# Patient Record
Sex: Female | Born: 1965 | Race: White | Hispanic: No | Marital: Married | Smoking: Never smoker
Health system: Southern US, Community
[De-identification: ages and names within clinical notes are randomized; demographics above are authoritative.]

## PROBLEM LIST (undated history)

## (undated) DIAGNOSIS — N943 Premenstrual tension syndrome: Secondary | ICD-10-CM

## (undated) DIAGNOSIS — T7840XA Allergy, unspecified, initial encounter: Secondary | ICD-10-CM

## (undated) DIAGNOSIS — Z973 Presence of spectacles and contact lenses: Secondary | ICD-10-CM

## (undated) DIAGNOSIS — D509 Iron deficiency anemia, unspecified: Secondary | ICD-10-CM

## (undated) DIAGNOSIS — N921 Excessive and frequent menstruation with irregular cycle: Secondary | ICD-10-CM

## (undated) DIAGNOSIS — G43909 Migraine, unspecified, not intractable, without status migrainosus: Secondary | ICD-10-CM

## (undated) DIAGNOSIS — J302 Other seasonal allergic rhinitis: Secondary | ICD-10-CM

## (undated) DIAGNOSIS — N920 Excessive and frequent menstruation with regular cycle: Secondary | ICD-10-CM

## (undated) DIAGNOSIS — F419 Anxiety disorder, unspecified: Secondary | ICD-10-CM

## (undated) DIAGNOSIS — D649 Anemia, unspecified: Secondary | ICD-10-CM

## (undated) DIAGNOSIS — N840 Polyp of corpus uteri: Secondary | ICD-10-CM

## (undated) HISTORY — PX: WISDOM TOOTH EXTRACTION: SHX21

## (undated) HISTORY — DX: Premenstrual tension syndrome: N94.3

## (undated) HISTORY — DX: Migraine, unspecified, not intractable, without status migrainosus: G43.909

## (undated) HISTORY — DX: Anxiety disorder, unspecified: F41.9

## (undated) HISTORY — DX: Excessive and frequent menstruation with regular cycle: N92.0

## (undated) HISTORY — DX: Anemia, unspecified: D64.9

## (undated) HISTORY — DX: Allergy, unspecified, initial encounter: T78.40XA

## (undated) HISTORY — PX: COLONOSCOPY WITH PROPOFOL: SHX5780

---

## 1998-02-23 ENCOUNTER — Other Ambulatory Visit: Admission: RE | Admit: 1998-02-23 | Discharge: 1998-02-23 | Payer: Self-pay | Admitting: Obstetrics and Gynecology

## 1998-09-15 ENCOUNTER — Inpatient Hospital Stay (HOSPITAL_COMMUNITY): Admission: AD | Admit: 1998-09-15 | Discharge: 1998-09-17 | Payer: Self-pay | Admitting: Obstetrics and Gynecology

## 1999-05-29 ENCOUNTER — Other Ambulatory Visit: Admission: RE | Admit: 1999-05-29 | Discharge: 1999-05-29 | Payer: Self-pay | Admitting: Obstetrics & Gynecology

## 2001-08-11 ENCOUNTER — Other Ambulatory Visit: Admission: RE | Admit: 2001-08-11 | Discharge: 2001-08-11 | Payer: Self-pay | Admitting: Obstetrics and Gynecology

## 2002-06-01 ENCOUNTER — Encounter: Admission: RE | Admit: 2002-06-01 | Discharge: 2002-06-01 | Payer: Self-pay | Admitting: Orthopedic Surgery

## 2002-06-01 ENCOUNTER — Encounter: Payer: Self-pay | Admitting: Orthopedic Surgery

## 2003-08-03 ENCOUNTER — Other Ambulatory Visit: Admission: RE | Admit: 2003-08-03 | Discharge: 2003-08-03 | Payer: Self-pay | Admitting: Obstetrics and Gynecology

## 2003-08-17 ENCOUNTER — Encounter: Admission: RE | Admit: 2003-08-17 | Discharge: 2003-08-17 | Payer: Self-pay | Admitting: Obstetrics and Gynecology

## 2004-08-15 ENCOUNTER — Other Ambulatory Visit: Admission: RE | Admit: 2004-08-15 | Discharge: 2004-08-15 | Payer: Self-pay | Admitting: Obstetrics and Gynecology

## 2004-11-14 ENCOUNTER — Ambulatory Visit: Payer: Self-pay | Admitting: Internal Medicine

## 2005-07-24 ENCOUNTER — Ambulatory Visit: Payer: Self-pay | Admitting: Internal Medicine

## 2005-11-27 ENCOUNTER — Other Ambulatory Visit: Admission: RE | Admit: 2005-11-27 | Discharge: 2005-11-27 | Payer: Self-pay | Admitting: Obstetrics and Gynecology

## 2005-12-11 ENCOUNTER — Encounter: Admission: RE | Admit: 2005-12-11 | Discharge: 2005-12-11 | Payer: Self-pay | Admitting: Obstetrics and Gynecology

## 2007-03-16 DIAGNOSIS — R51 Headache: Secondary | ICD-10-CM | POA: Insufficient documentation

## 2007-03-16 DIAGNOSIS — R519 Headache, unspecified: Secondary | ICD-10-CM | POA: Insufficient documentation

## 2007-03-16 DIAGNOSIS — F411 Generalized anxiety disorder: Secondary | ICD-10-CM | POA: Insufficient documentation

## 2007-03-16 DIAGNOSIS — F329 Major depressive disorder, single episode, unspecified: Secondary | ICD-10-CM | POA: Insufficient documentation

## 2007-04-01 ENCOUNTER — Ambulatory Visit: Payer: Self-pay | Admitting: Internal Medicine

## 2007-04-01 DIAGNOSIS — L708 Other acne: Secondary | ICD-10-CM | POA: Insufficient documentation

## 2007-09-24 ENCOUNTER — Other Ambulatory Visit: Admission: RE | Admit: 2007-09-24 | Discharge: 2007-09-24 | Payer: Self-pay | Admitting: Obstetrics & Gynecology

## 2007-10-07 ENCOUNTER — Encounter: Admission: RE | Admit: 2007-10-07 | Discharge: 2007-10-07 | Payer: Self-pay | Admitting: Obstetrics and Gynecology

## 2009-04-07 ENCOUNTER — Ambulatory Visit: Payer: Self-pay | Admitting: Internal Medicine

## 2009-04-19 ENCOUNTER — Encounter: Admission: RE | Admit: 2009-04-19 | Discharge: 2009-04-19 | Payer: Self-pay | Admitting: Obstetrics and Gynecology

## 2009-07-28 ENCOUNTER — Encounter: Payer: Self-pay | Admitting: Internal Medicine

## 2009-07-28 ENCOUNTER — Encounter: Payer: Self-pay | Admitting: *Deleted

## 2009-07-28 LAB — CONVERTED CEMR LAB
ALT: 13 units/L
HCT: 37.4 %
Hemoglobin: 12.7 g/dL
LDL Cholesterol: 99 mg/dL
TSH: 0.868 microintl units/mL
Total CHOL/HDL Ratio: 3.1

## 2009-08-23 ENCOUNTER — Encounter: Payer: Self-pay | Admitting: *Deleted

## 2010-05-29 ENCOUNTER — Telehealth: Payer: Self-pay | Admitting: *Deleted

## 2010-06-20 ENCOUNTER — Ambulatory Visit: Payer: Self-pay | Admitting: Internal Medicine

## 2010-08-16 ENCOUNTER — Other Ambulatory Visit: Payer: Self-pay | Admitting: Obstetrics and Gynecology

## 2010-08-16 DIAGNOSIS — Z1239 Encounter for other screening for malignant neoplasm of breast: Secondary | ICD-10-CM

## 2010-08-23 ENCOUNTER — Ambulatory Visit
Admission: RE | Admit: 2010-08-23 | Discharge: 2010-08-23 | Disposition: A | Discharge status: Home / Self Care | Payer: PRIVATE HEALTH INSURANCE | Source: Ambulatory Visit | Attending: Obstetrics and Gynecology | Admitting: Obstetrics and Gynecology

## 2010-08-23 DIAGNOSIS — Z1239 Encounter for other screening for malignant neoplasm of breast: Secondary | ICD-10-CM

## 2010-08-23 NOTE — Progress Notes (Signed)
Summary: REFILL REQUEST (Paxil)  Phone Note Refill Request Message from:  Patient on May 29, 2010 2:06 PM  Refills Requested: Medication #1:  PAROXETINE HCL 10 MG TABS 1/2 to 1 by mouth once daily.   Notes: [facility] [place].Marland KitchenMarland KitchenMarland KitchenPt has appt on 11/30  at  3:15pm.    Initial call taken by: Debbra Riding,  May 29, 2010 2:06 PM  Follow-up for Phone Call        Rx already sent to pharmacy. Follow-up by: Romualdo Bolk, CMA (AAMA),  May 29, 2010 2:09 PM

## 2010-08-23 NOTE — Miscellaneous (Signed)
Summary: labs done by Ria Comment, FNP  Clinical Lists Changes  Observations: Added new observation of TSH: 0.868 microintl units/mL (07/28/2009 9:23) Added new observation of CHOL/HDL: 3.1  (07/28/2009 9:23) Added new observation of LDL: 99 mg/dL (63/07/6008 9:32) Added new observation of HDL: 57 mg/dL (35/57/3220 2:54) Added new observation of TRIGLYC TOT: 105 mg/dL (27/12/2374 2:83) Added new observation of CHOLESTEROL: 177 mg/dL (15/17/6160 7:37) Added new observation of SGPT (ALT): 13 units/L (07/28/2009 9:23) Added new observation of SGOT (AST): 20 units/L (07/28/2009 9:23) Added new observation of CREATININE: 0.82 mg/dL (10/62/6948 5:46) Added new observation of HCT: 37.4 % (07/28/2009 9:23) Added new observation of HGB: 12.7 g/dL (27/09/5007 3:81)

## 2010-08-23 NOTE — Assessment & Plan Note (Signed)
Summary: MED CK / REFILL // RS   Vital Signs:  Patient profile:   45 year old female Menstrual status:  regular Weight:      117 pounds BMI:     22.01 Temp:     98.4 degrees F oral Pulse rate:   84 / minute BP sitting:   108 / 72  (left arm) Cuff size:   regular  Vitals Entered By: Alfred Levins, CMA (June 20, 2010 3:28 PM) CC: renew med   History of Present Illness: Desiree Garcia  comes in today  for follow up of meds .  Her last visit was September 2010 . She sees her gyne   otherwise genreally healthy and no other health change .  Had stopped the med  in the summer. and then was having problems  related to teenagers at home 9 has 45 Yo at [facility] and asked for refill  and was out and in for this.   Paxil  had always been helpful in the past however concerned about side effects of the medication including weight gain and sexual side effects. However more recently she feels that need of help becuase of  increased anxiety.    denies specific depression.  Also described a long-standing problem with discoloration of her distal middle fingers when it gets cold. She she states they turn white  and blue like Raynauds  phenomenon. she does not get this on her feet sometimes gets it on her thumbs.   There is no history of arthritis fever joint swelling. 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 Otherwise no repeated injury vibration to hands. She denies numbness.    Preventive Screening-Counseling & Management  Alcohol-Tobacco     Alcohol drinks/day: <1     Alcohol type: Wine     Smoking Status: never  Current Medications (verified): 1)  Paroxetine Hcl 10 Mg Tabs (Paroxetine Hcl) .... 1/2 To 1 By Mouth Once Daily  Allergies (verified): No Known Drug Allergies  Past History:  Past medical, surgical, family and social histories (including risk factors) reviewed, and no changes noted (except as noted below).  Past Medical History: Reviewed  history from 04/07/2009 and no changes required. Depression Scarlet Fever - childhood Anxiety Fatigue Headache Acne accutane rx  2010  Past Surgical History: Reviewed history from 03/16/2007 and no changes required. CB x2  Family History: Reviewed history from 04/07/2009 and no changes required. Family History Diabetes 1st degree relative Family History High cholesterol  Father:  COPD  ex smoker  Mother: HT Siblings:  sisters healthy.   Social History: Reviewed history from 03/16/2007 and no changes required. Occupation: Oral Maxillary Facial Surgical Assistant Married Never Smoked Alcohol use-yes Drug use-no Regular exercise-no  Review of Systems  The patient denies anorexia, fever, weight loss, weight gain, vision loss, decreased hearing, hoarseness, chest pain, syncope, dyspnea on exertion, peripheral edema, prolonged cough, headaches, abdominal pain, melena, hematochezia, severe indigestion/heartburn, hematuria, muscle weakness, difficulty walking, depression, abnormal bleeding, enlarged lymph nodes, and angioedema.    Physical Exam  General:  Well-developed,well-nourished,in no acute distress; alert,appropriate and cooperative throughout examination Head:  normocephalic, atraumatic, and no abnormalities observed.   Eyes:  vision grossly intact and pupils equal.   Neck:  No deformities, masses, or tenderness noted. Lungs:  Normal respiratory effort, chest expands symmetrically. Lungs are clear to auscultation, no crackles or wheezes.no dullness.   Heart:  Normal rate and regular rhythm. S1 and S2 normal without gallop,  murmur, click, rub or other extra sounds. faint sem lusb when laying down Abdomen:  Bowel sounds positive,abdomen soft and non-tender without masses, organomegaly or   noted. Msk:  no joint swelling, no joint warmth, and no redness over joints.   Pulses:  pulses intact without delay   Extremities:  no clubbing cyanosis or edema  no atrophy could grip    Neurologic:  alert & oriented X3, strength normal in all extremities, sensation intact to pinprick, and gait normal.   Skin:  turgor normal, color normal, no ecchymoses, and no petechiae.  nope chronic changes on hands Cervical Nodes:  No lymphadenopathy noted Psych:  Oriented X3, normally interactive, good eye contact, not anxious appearing, and not depressed appearing.     Impression & Recommendations:  Problem # 1:  ANXIETY (ICD-300.00) had stopped medication a few months ago. Discussed other options which would probably be controller serotonin medicines having potential side effects but perhaps not the same she prefers at this point to try low-dose Paxil at the 5 mg dose she will take a half of her current prescription and see how she does she can call for trials of other medications or interventions.. Her updated medication list for this problem includes:    Paroxetine Hcl 10 Mg Tabs (Paroxetine hcl) .Marland Kitchen... 1/2 to 1 by mouth once daily  Problem # 2:  ? 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  no systemic symptoms.   ? if  at all related to her job which requires manipualtion and tratioin using these fingers.   Marland Kitchen at this point no intervention except warming and to follow up if gets systemic signs or i r progressive.  Complete Medication List: 1)  Paroxetine Hcl 10 Mg Tabs (Paroxetine hcl) .... 1/2 to 1 by mouth once daily  Patient Instructions: 1)  trial of 5 mg  per day of paxil     trial and then see what to do .  2)  consider cognitive  therapy. 3)  call if raynauds  if   progressive.   Prescriptions: PAROXETINE HCL 10 MG TABS (PAROXETINE HCL) 1/2 to 1 by mouth once daily  #30 Tablet x 6   Entered and Authorized by:   Madelin Headings MD   Signed by:   Madelin Headings MD on 06/20/2010   Method used:   [facility]        [facility] [address] [facility]       [address]       [place], [place]  [address]       Ph:  [phone] or [phone]       Fax: [phone]   RxID:   [id number]    Orders Added: 1)  Est. Patient Level IV [03474]   greater than 50% of visit spent in counseling   25 minutes

## 2010-08-29 ENCOUNTER — Other Ambulatory Visit: Payer: Self-pay | Admitting: Obstetrics and Gynecology

## 2010-08-29 DIAGNOSIS — R928 Other abnormal and inconclusive findings on diagnostic imaging of breast: Secondary | ICD-10-CM

## 2010-08-30 ENCOUNTER — Ambulatory Visit
Admission: RE | Admit: 2010-08-30 | Discharge: 2010-08-30 | Disposition: A | Discharge status: Home / Self Care | Payer: PRIVATE HEALTH INSURANCE | Source: Ambulatory Visit

## 2010-08-30 ENCOUNTER — Ambulatory Visit: Admission: RE | Admit: 2010-08-30 | Payer: PRIVATE HEALTH INSURANCE | Source: Ambulatory Visit

## 2010-08-30 ENCOUNTER — Other Ambulatory Visit: Payer: Self-pay | Admitting: Obstetrics and Gynecology

## 2010-08-30 ENCOUNTER — Ambulatory Visit
Admission: RE | Admit: 2010-08-30 | Discharge: 2010-08-30 | Disposition: A | Discharge status: Home / Self Care | Payer: PRIVATE HEALTH INSURANCE | Source: Ambulatory Visit | Attending: Obstetrics and Gynecology | Admitting: Obstetrics and Gynecology

## 2010-08-30 DIAGNOSIS — R928 Other abnormal and inconclusive findings on diagnostic imaging of breast: Secondary | ICD-10-CM

## 2011-07-30 ENCOUNTER — Other Ambulatory Visit: Payer: Self-pay | Admitting: Obstetrics and Gynecology

## 2011-07-30 DIAGNOSIS — Z1231 Encounter for screening mammogram for malignant neoplasm of breast: Secondary | ICD-10-CM

## 2011-08-28 ENCOUNTER — Ambulatory Visit
Admission: RE | Admit: 2011-08-28 | Discharge: 2011-08-28 | Disposition: A | Discharge status: Home / Self Care | Payer: PRIVATE HEALTH INSURANCE | Source: Ambulatory Visit | Attending: Obstetrics and Gynecology | Admitting: Obstetrics and Gynecology

## 2011-08-28 DIAGNOSIS — Z1231 Encounter for screening mammogram for malignant neoplasm of breast: Secondary | ICD-10-CM

## 2012-09-01 ENCOUNTER — Other Ambulatory Visit: Payer: Self-pay | Admitting: *Deleted

## 2012-09-01 DIAGNOSIS — Z1231 Encounter for screening mammogram for malignant neoplasm of breast: Secondary | ICD-10-CM

## 2012-09-23 ENCOUNTER — Ambulatory Visit
Admission: RE | Admit: 2012-09-23 | Discharge: 2012-09-23 | Disposition: A | Discharge status: Home / Self Care | Payer: BC Managed Care – PPO | Source: Ambulatory Visit | Attending: *Deleted | Admitting: *Deleted

## 2012-09-23 ENCOUNTER — Ambulatory Visit: Payer: PRIVATE HEALTH INSURANCE

## 2012-09-23 DIAGNOSIS — Z1231 Encounter for screening mammogram for malignant neoplasm of breast: Secondary | ICD-10-CM

## 2012-11-30 ENCOUNTER — Encounter: Payer: Self-pay | Admitting: *Deleted

## 2012-12-01 ENCOUNTER — Ambulatory Visit (INDEPENDENT_AMBULATORY_CARE_PROVIDER_SITE_OTHER): Payer: BC Managed Care – PPO | Admitting: Nurse Practitioner

## 2012-12-01 ENCOUNTER — Encounter: Payer: Self-pay | Admitting: Nurse Practitioner

## 2012-12-01 ENCOUNTER — Encounter: Payer: Self-pay | Admitting: *Deleted

## 2012-12-01 VITALS — BP 118/60 | HR 62 | Resp 12 | Ht 61.0 in | Wt 108.4 lb

## 2012-12-01 DIAGNOSIS — Z01419 Encounter for gynecological examination (general) (routine) without abnormal findings: Secondary | ICD-10-CM

## 2012-12-01 DIAGNOSIS — Z0000 Encounter for general adult medical examination without abnormal findings: Secondary | ICD-10-CM

## 2012-12-01 LAB — POCT URINALYSIS DIPSTICK
Leukocytes, UA: NEGATIVE
Urobilinogen, UA: NEGATIVE
pH, UA: 6.5

## 2012-12-01 NOTE — Progress Notes (Signed)
47 y.o. G2P2 Married Caucasian Fe here for annual exam.  Menses 28 days, flow is heavier 2-3 days changing every hour using super plus tampon and pad.  History of anemia and started on iron supplements by PCP.  Mother recently diagnosed with lung cancer and will be starting chemo - patient is trying to be supportive but this has caused a lot of stress.  Patient's last menstrual period was 11/07/2012.          Sexually active: yes  The current method of family planning is vasectomy.    Exercising: yes  walk and jog Smoker:  no  Health Maintenance: Pap:  08/16/2011 normal with negative HR HPV MMG:  09/23/2012 normal  Colonoscopy:  never TDaP:  11/2011 Labs: PCP does lab (blood) work.    reports that she has never smoked. She does not have any smokeless tobacco history on file. She reports that she drinks about 0.6 ounces of alcohol per week. She reports that she does not use illicit drugs.  Past Medical History  Diagnosis Date  . Anxiety   . Menorrhagia   . PMS (premenstrual syndrome)   . Anemia     History reviewed. No pertinent past surgical history.  Current Outpatient Prescriptions  Medication Sig Dispense Refill  . escitalopram (LEXAPRO) 5 MG tablet Take 5 mg by mouth daily.      . Iron-Vitamin C (IRON 100/C) 100-250 MG TABS Take by mouth daily.      . Calcium Carbonate-Vitamin D (CALCIUM + D PO) Take by mouth.      . Multiple Vitamin (MULTIVITAMIN) capsule Take 1 capsule by mouth daily.       No current facility-administered medications for this visit.    Family History  Problem Relation Age of Onset  . Hyperlipidemia Mother   . Hypertension Mother   . COPD Father     ROS:  Pertinent items are noted in HPI.  Otherwise, a comprehensive ROS was negative.  Exam:   BP 118/60  Pulse 62  Resp 12  Ht 5\' 1"  (1.549 m)  Wt 108 lb 6.4 oz (49.17 kg)  BMI 20.49 kg/m2  LMP 11/07/2012 Height: 5\' 1"  (154.9 cm)  Ht Readings from Last 3 Encounters:  12/01/12 5\' 1"  (1.549 m)   04/07/09 5' 1.25" (1.556 m)    General appearance: alert, cooperative and appears stated age Head: Normocephalic, without obvious abnormality, atraumatic Neck: no adenopathy, supple, symmetrical, trachea midline and thyroid normal to inspection and palpation Lungs: clear to auscultation bilaterally Breasts: normal appearance, no masses or tenderness Heart: regular rate and rhythm Abdomen: soft, non-tender; no masses,  no organomegaly Extremities: extremities normal, atraumatic, no cyanosis or edema Skin: Skin color, texture, turgor normal. No rashes or lesions Lymph nodes: Cervical, supraclavicular, and axillary nodes normal. No abnormal inguinal nodes palpated Neurologic: Grossly normal   Pelvic: External genitalia:  no lesions              Urethra:  normal appearing urethra with no masses, tenderness or lesions              Bartholin's and Skene's: normal                 Vagina: normal appearing vagina with normal color and discharge, no lesions              Cervix: anteverted              Pap taken: no Bimanual Exam:  Uterus:  normal size, contour,  position, consistency, mobility, non-tender              Adnexa: no mass, fullness, tenderness               Rectovaginal: Confirms               Anus:  normal sphincter tone, no lesions  A:  Well Woman with normal exam  History of menorrhagia and anemia  Husband vasectomy  Situational stressors  P:   Pap smear as per guidelines   Mammogram due 3/15  Will call back if menses gets worse  counseled on breast self exam, adequate intake of calcium and vitamin D,   diet and exercise  return annually or prn  An After Visit Summary was printed and given to the patient.

## 2012-12-01 NOTE — Patient Instructions (Addendum)

## 2012-12-03 NOTE — Progress Notes (Signed)
Encounter reviewed by Dr. Brook Silva.  

## 2013-07-19 ENCOUNTER — Encounter: Payer: Self-pay | Admitting: Nurse Practitioner

## 2013-10-04 ENCOUNTER — Other Ambulatory Visit: Payer: Self-pay

## 2013-10-04 DIAGNOSIS — Z1231 Encounter for screening mammogram for malignant neoplasm of breast: Secondary | ICD-10-CM

## 2013-10-27 ENCOUNTER — Ambulatory Visit: Payer: BC Managed Care – PPO

## 2013-11-10 ENCOUNTER — Ambulatory Visit
Admission: RE | Admit: 2013-11-10 | Discharge: 2013-11-10 | Disposition: A | Discharge status: Home / Self Care | Payer: Self-pay | Source: Ambulatory Visit

## 2013-11-10 ENCOUNTER — Encounter (INDEPENDENT_AMBULATORY_CARE_PROVIDER_SITE_OTHER): Payer: Self-pay

## 2013-11-10 DIAGNOSIS — Z1231 Encounter for screening mammogram for malignant neoplasm of breast: Secondary | ICD-10-CM

## 2013-12-03 ENCOUNTER — Ambulatory Visit: Payer: BC Managed Care – PPO | Admitting: Nurse Practitioner

## 2013-12-07 ENCOUNTER — Encounter: Payer: Self-pay | Admitting: Nurse Practitioner

## 2013-12-07 ENCOUNTER — Ambulatory Visit (INDEPENDENT_AMBULATORY_CARE_PROVIDER_SITE_OTHER): Payer: BC Managed Care – PPO | Admitting: Nurse Practitioner

## 2013-12-07 VITALS — BP 110/56 | HR 64 | Ht 61.0 in | Wt 112.0 lb

## 2013-12-07 DIAGNOSIS — Z01419 Encounter for gynecological examination (general) (routine) without abnormal findings: Secondary | ICD-10-CM

## 2013-12-07 DIAGNOSIS — N920 Excessive and frequent menstruation with regular cycle: Secondary | ICD-10-CM

## 2013-12-07 DIAGNOSIS — Z975 Presence of (intrauterine) contraceptive device: Secondary | ICD-10-CM

## 2013-12-07 NOTE — Patient Instructions (Signed)

## 2013-12-07 NOTE — Progress Notes (Signed)
Patient ID: Desiree Garcia, female   DOB: Feb 24, 1966, 48 y.o.   MRN: [id number] 48 y.o. G2P2 Married Caucasian Desiree Garcia here for annual exam.  Menses last 7-9 days, heavy for 4-5 days, moderate to light.  Changing tampon every 2 hours. Wears extra pad prn.  Clotting, some cramps. Some PMS. Still having to take Desiree Garcia daily.  She is now ready to consider intervention.  Patient's last menstrual period was 11/17/2013.          Sexually active: yes  The current method of family planning is vasectomy.    Exercising: yes  running and walking Smoker:  no  Health Maintenance: Pap: 08/16/2011 normal with negative HR HPV  MMG: 11/10/13, Bi-Rads 1: negative  TDaP: 11/2011  Labs: PCP does lab (blood) work.  Dr Clelia CroftShaw   reports that she has never smoked. She has never used smokeless tobacco. She reports that she drinks about 1.8 ounces of alcohol per week. She reports that she does not use illicit drugs.  Past Medical History  Diagnosis Date  . Anxiety   . Menorrhagia   . PMS (premenstrual syndrome)   . Anemia     Past Surgical History  Procedure Laterality Date  . Wisdom tooth extraction Bilateral age 48    Current Outpatient Prescriptions  Medication Sig Dispense Refill  . Calcium Carbonate-Vitamin D (CALCIUM + D PO) Take by mouth.      . escitalopram (LEXAPRO) 5 MG tablet Take 5 mg by mouth daily.      . ferrous sulfate 325 (65 Desiree Garcia) MG tablet Take 325 mg by mouth daily with breakfast.      . Multiple Vitamin (MULTIVITAMIN) capsule Take 1 capsule by mouth daily.       No current facility-administered medications for this visit.    Family History  Problem Relation Age of Onset  . Hyperlipidemia Mother   . Hypertension Mother   . Cancer - Lung Mother [id number]    stage IV non small cell, chemo starting 5/14  . COPD Father     ROS:  Pertinent items are noted in HPI.  Otherwise, a comprehensive ROS was negative.  Exam:   BP 110/56  Pulse 64  Ht 5\' 1"  (1.549 m)  Wt 112 lb (50.803 kg)  BMI 21.17 kg/m2   LMP 11/17/2013 Height: 5\' 1"  (154.9 cm)  Ht Readings from Last 3 Encounters:  12/07/13 5\' 1"  (1.549 m)  12/01/12 5\' 1"  (1.549 m)  04/07/09 5' 1.25" (1.556 m)    General appearance: alert, cooperative and appears stated age Head: Normocephalic, without obvious abnormality, atraumatic Neck: no adenopathy, supple, symmetrical, trachea midline and thyroid normal to inspection and palpation Lungs: clear to auscultation bilaterally Breasts: normal appearance, no masses or tenderness Heart: regular rate and rhythm Abdomen: soft, non-tender; no masses,  no organomegaly Extremities: extremities normal, atraumatic, no cyanosis or edema Skin: Skin color, texture, turgor normal. No rashes or lesions Lymph nodes: Cervical, supraclavicular, and axillary nodes normal. No abnormal inguinal nodes palpated Neurologic: Grossly normal   Pelvic: External genitalia:  no lesions              Urethra:  normal appearing urethra with no masses, tenderness or lesions              Bartholin's and Skene's: normal                 Vagina: normal appearing vagina with normal color and discharge, no lesions  Cervix: anteverted              Pap taken: no Bimanual Exam:  Uterus:  normal size, contour, position, consistency, mobility, non-tender enlargement Vs. positional               Adnexa: no mass, fullness, tenderness               Rectovaginal: Confirms               Anus:  normal sphincter tone, no lesions  A:  Well Woman with normal exam  History of menorrhagia with anemia  P:   Reviewed health and wellness pertinent to exam  Pap smear not taken today  Mammogram is due 10/2014  Order placed for Mirena IUD  Counseled on breast self exam, mammography screening, adequate intake of calcium and vitamin D, diet and exercise return annually or prn  An After Visit Summary was printed and given to the patient.

## 2013-12-13 NOTE — Progress Notes (Signed)
Encounter reviewed by Dr. Brook Silva.  

## 2013-12-14 ENCOUNTER — Telehealth: Payer: Self-pay | Admitting: Nurse Practitioner

## 2013-12-14 NOTE — Telephone Encounter (Signed)
Left message for patient to call back. Need to go over benefits for iud insertion.

## 2013-12-15 NOTE — Telephone Encounter (Signed)
Scheduled patient for IUD insertion with Dr. Hyacinth Meeker for 12/17/13 at 1400. Time okay per Kennon Rounds.  Message left to return call to [place] at [phone] to ensure she can take this appointment.

## 2013-12-15 NOTE — Telephone Encounter (Signed)
Patient returned call and is very thankful for appointment on Friday for IUD insertion.   Pre procedure instructions given.  Motrin instructions given. Motrin=Advil=Ibuprofen, 800 mg one hour before appointment. Eat a meal and hydrate well before appointment.  Patient is agreeable and verbalized understanding of instructions.   Routing to provider for final review. Patient agreeable to disposition. Will close encounter

## 2013-12-15 NOTE — Telephone Encounter (Signed)
Spoke with patient. She states she started her period today. She is G2P2. She states that menses usually last 7-8 days. She would like to schedule IUD insertion and requests Friday afternoon specifically. I advised that I would check the schedule and return her call. She states this is her only availability and willing to wait until next month if cannot have IUD placed for Friday afternoon.

## 2013-12-15 NOTE — Telephone Encounter (Signed)
Spoke with patient. Advised that per benefits quote received, IUD and insertion is covered at 100%. There will be 0 patient liability. Patient is to call within the first 5 days of her cycle to schedule insertion. °

## 2013-12-17 ENCOUNTER — Ambulatory Visit (INDEPENDENT_AMBULATORY_CARE_PROVIDER_SITE_OTHER): Payer: BC Managed Care – PPO | Admitting: Obstetrics & Gynecology

## 2013-12-17 VITALS — BP 98/58 | HR 60 | Resp 16 | Ht 64.25 in | Wt 114.6 lb

## 2013-12-17 DIAGNOSIS — N920 Excessive and frequent menstruation with regular cycle: Secondary | ICD-10-CM

## 2013-12-17 DIAGNOSIS — Z975 Presence of (intrauterine) contraceptive device: Secondary | ICD-10-CM

## 2013-12-17 NOTE — Addendum Note (Signed)
Addended by: Jerene Bears on: 12/17/2013 05:16 PM   Modules accepted: Orders

## 2013-12-17 NOTE — Progress Notes (Signed)
[name] presents for discussion of IUD insertion.  Pt with cycles that have continually gotten heavier over the past several years.  No passes clots and has anemia and is on iron.  Has questionable enlarged uterus on physical exam.    D/W pt doing physical exam first and then proceeding with endometrial biopsy first.  If exam is normal, could also place IUD today.  If uterus is indeed enlarged, then could discuss proceeding with PUS before IUD placement.  Pt very comfortable with this plan.    LMP:  Patient's last menstrual period was 12/15/2013.  Healthy female:  WNWD WF NAD  Pelvic exam: Vulva;normal Vagina:normal vagina, no discharge, exudate, lesion, or erythema Cervix:Non-tender, Negative CMT, no lesions or redness Uterus:retroverted, mobile, 10-12 weeks size.  Cannot palpate fibroids   Endometrial biopsy recommended.  Discussed with patient.  Verbal and written consent obtained.   Procedure:  Speculum placed.  Cervix visualized and cleansed with betadine prep.  A single toothed tenaculum was applied to the anterior lip of the cervix.  Endometrial pipelle was advanced through the cervix into the endometrial cavity without difficulty.  Pipelle passed to 8cm.  Suction applied and pipelle removed with good tissue sample obtained.  Tenculum removed.  No bleeding noted.  Patient tolerated procedure well.  A: Menorrhagia  P:Endometrial biopsy pending. Pt will return for u/s before proceeding with IUD placement.

## 2013-12-17 NOTE — Patient Instructions (Signed)

## 2013-12-20 ENCOUNTER — Telehealth: Payer: Self-pay | Admitting: Obstetrics & Gynecology

## 2013-12-20 NOTE — Telephone Encounter (Signed)
Spoke with patient regarding concern. Advised that per Dr Hyacinth Meeker, PUS will be performed and IUD will be discussed so that patient can make a decision. Patient agreeable and will keep tomorrows appt.

## 2013-12-20 NOTE — Telephone Encounter (Signed)
Left message for patient to call back  

## 2013-12-20 NOTE — Telephone Encounter (Signed)
Pt is calling regarding her appointment for iud insertion tomorrow. She would like to wait until she has gotten her biopsy results back first. Pt didn't mention cancelling her ultrasound

## 2013-12-21 ENCOUNTER — Other Ambulatory Visit (INDEPENDENT_AMBULATORY_CARE_PROVIDER_SITE_OTHER): Payer: BC Managed Care – PPO

## 2013-12-21 ENCOUNTER — Encounter: Payer: Self-pay | Admitting: Obstetrics & Gynecology

## 2013-12-21 ENCOUNTER — Ambulatory Visit (INDEPENDENT_AMBULATORY_CARE_PROVIDER_SITE_OTHER): Payer: BC Managed Care – PPO | Admitting: Obstetrics & Gynecology

## 2013-12-21 VITALS — BP 118/62

## 2013-12-21 DIAGNOSIS — Z30430 Encounter for insertion of intrauterine contraceptive device: Secondary | ICD-10-CM

## 2013-12-21 DIAGNOSIS — N920 Excessive and frequent menstruation with regular cycle: Secondary | ICD-10-CM

## 2013-12-21 DIAGNOSIS — N852 Hypertrophy of uterus: Secondary | ICD-10-CM

## 2013-12-21 HISTORY — PX: INTRAUTERINE DEVICE (IUD) INSERTION: SHX5877

## 2013-12-21 NOTE — Progress Notes (Addendum)
48 y.o. Desiree Garcia here for a pelvic ultrasound due to menorrhagia and enlarged uterus on physical exam.  Biopsy from last week is still pending.  Pt is pretty sure she wants to proceed with IUD placement.    Patient's last menstrual period was 12/15/2013.  Sexually active:  yes  Contraception: vasectomy  FINDINGS: UTERUS: 8.6 x 6.9 x 5.1cm, with 2.0 anterior fibroid and small 1mm posterior fibroid, both subserosal EMS: 4.61mm, pt actively bleeding ADNEXA:   Left ovary 3.3 x 1.4 x 1.5cm with 1.5cm follicle   Right ovary  3.3 x 1.6 x 1.3cm CUL DE SAC:  Small amount of fluid in posterior cul de sac  D/W pt images.  Biopsy result from last week is not back.  Pt would like to discuss options.  OCPs, Mirena IUD, endometrial ablation, and hysterectomy discussed.  Procedures, improvement in bleedings, risks discussed.  After discussion, pt decided that she would like to proceed with IUD insertion.  Consent obtained.   Procedure:  Speculum inserted into vagina. Cervix visualized and cleansed with betadine solution X 3. Tenaculum placed on cervix at 12 o'clock position(s).  Uterus sounded to 8.5 centimeters.  Mirena IUD removed from sterile packet and under sterile conditions inserted to fundus of uterus.  Introducer removed without difficulty.  IUD string trimmed to 2 centimeters.  Remainder string given to patient to feel for identification.  Tenaculum removed.  No bleeding noted.  Speculum removed.  Uterus palpated normal.  Patient tolerated procedure well. Lot #: [id number].  Exp 8/17.  Assessment:  Menorrhagia, subserosal fibroids  Plan: s/p Mirena IUD placement.  Recheck 6 weeks.  ~15 minutes spent with patient >50% of time was in face to face discussion of above in addition to IUD placement.

## 2013-12-24 ENCOUNTER — Telehealth: Payer: Self-pay

## 2013-12-24 NOTE — Telephone Encounter (Signed)
Spoke with patient and message from Dr. Hyacinth Meeker given. She states she is feeling well and not having any concerns with IUD.  She will follow up as scheduled.   Routing to provider for final review. Patient agreeable to disposition. Will close encounter

## 2013-12-24 NOTE — Telephone Encounter (Signed)
Patient returning Kaitlyn's call. °

## 2013-12-24 NOTE — Telephone Encounter (Signed)
Message copied by Desiree Garcia on Fri Dec 24, 2013 11:41 AM ------      Message from: Jerene Bears      Created: Wed Dec 22, 2013  8:34 AM       Please inform pt biopsy was negative for abnormal cells.  Showed tissue consistent with heavy cycles.  Placing IUD yesterday was good choice.  I'll see her in 6 weeks for follow up exam. ------

## 2013-12-24 NOTE — Telephone Encounter (Signed)
Left message to call Kaitlyn at [phone]. 

## 2014-01-17 ENCOUNTER — Telehealth: Payer: Self-pay | Admitting: Obstetrics & Gynecology

## 2014-01-17 NOTE — Telephone Encounter (Signed)
Agree with recommendations made.  Encounter closed.

## 2014-01-17 NOTE — Telephone Encounter (Signed)
Patient has been spotting since 15th. Has to change panty liner every 2 hrs. She had IUD inserted 12/21/13.

## 2014-01-17 NOTE — Telephone Encounter (Signed)
Spoke with patient at time of incoming call.  Mirena IUD placed 12/21/13 and did not have any bleeding until 6/15 and now having daily spotting/light bleeding. Only wearing panty liners. No pain, no fevers, no vaginal discharge. Patient states she has not felt for her strings.  Advised patient that irregular bleeding such as spotting or light bleeding can occur with Mirena for 3-6 months of beginning to use.  Advised patient to check her strings and call us back if she cannot feel for strings for office visit.  Advised continue to monitor for bleeding, call if worsens  or seek immediate medical care if bleeding worsens or soaking through 1 pad/tampon per hour.  Patient verbalized understanding.  Advised would send this message to Dr. Hyacinth MeekerMiller and would call her back if Dr. Hyacinth MeekerMiller has any further instructions. Patient is agreeable.  Patient requested r/s of her follow up iud appointment and it was changed at this time to 7/31 with Dr. Hyacinth MeekerMiller.

## 2014-02-04 ENCOUNTER — Ambulatory Visit: Payer: BC Managed Care – PPO | Admitting: Obstetrics & Gynecology

## 2014-02-18 ENCOUNTER — Encounter: Payer: Self-pay | Admitting: Obstetrics & Gynecology

## 2014-02-18 ENCOUNTER — Ambulatory Visit (INDEPENDENT_AMBULATORY_CARE_PROVIDER_SITE_OTHER): Payer: BC Managed Care – PPO | Admitting: Obstetrics & Gynecology

## 2014-02-18 VITALS — BP 98/56 | HR 60 | Resp 18 | Ht 64.25 in | Wt 113.0 lb

## 2014-02-18 DIAGNOSIS — Z30431 Encounter for routine checking of intrauterine contraceptive device: Secondary | ICD-10-CM

## 2014-02-18 NOTE — Progress Notes (Signed)
Subjective:     Patient ID: Lamonte RicherRae S Woon, female   DOB: [birth year], 48 y.o.   MRN: [id number]  HPI 48 yo G2P2 MWF here for IUD recheck.  Mirena placed 12/21/13.  LMP 12/15/13.  Hasn't really had a "real" cycle since.  Just spotting now.  This is still present but does seem to be improving.  Has felt string twice.  No pain with intercourse.  Didn't have much cramping after placement.   Review of Systems  All other systems reviewed and are negative.      Objective:   Physical Exam  Constitutional: She is oriented to person, place, and time. She appears well-developed and well-nourished.  Abdominal: Soft. Bowel sounds are normal.  Genitourinary:  NAEFG, vagina without lesions or discharge, 2cm IUD string noted at cervical os.  No CMT.  Neurological: She is alert and oriented to person, place, and time.  Skin: Skin is warm.  Psychiatric: She has a normal mood and affect.       Assessment:     IUD recheck, still spotting     Plan:     Pt reassured.  Will call and check on her in six week.

## 2014-04-18 ENCOUNTER — Telehealth: Payer: Self-pay

## 2014-04-18 NOTE — Telephone Encounter (Signed)
Message copied by Elisha Headland on Mon Apr 18, 2014  4:33 PM ------      Message from: Jerene Bears      Created: Fri Apr 15, 2014  5:34 AM      Regarding: bleeding with IUD       Can you check on pt and see how she is doing?  Had IUD placed and had some irregular bleeding this summer.  Just wanted to check in on her.  Thanks.            MSM ------

## 2014-04-18 NOTE — Telephone Encounter (Signed)
Lmtcb//kn 

## 2014-05-05 NOTE — Telephone Encounter (Signed)
I met patient during personal appointment (other people present in room). She states someone from our office called her and she was unsure why. She asked me to check. Upon return to office, return this call to patient's call and advised kelly called to check on her bleeding following IUD insertion. Patient states irregular bleeding has improved significantly and only occasional discomfort and occasional discharge. Intermittent light spotting but not a problem. Overall very satisfied with IUD. Has checked strings a few times but not every month. Instructed to call if ever has unusual pain or bleeding or has concerns.  Routing to provider for final review. Patient agreeable to disposition. Will close encounter

## 2014-05-23 ENCOUNTER — Encounter: Payer: Self-pay | Admitting: Obstetrics & Gynecology

## 2014-11-17 ENCOUNTER — Telehealth: Payer: Self-pay | Admitting: Obstetrics & Gynecology

## 2014-11-17 DIAGNOSIS — Z30432 Encounter for removal of intrauterine contraceptive device: Secondary | ICD-10-CM

## 2014-11-17 NOTE — Telephone Encounter (Signed)
Spoke with patient. Patient states that she had Mirena IUD placed with Dr.Miller in June of 2015. "Since then I have been feeling fatigued and I am already on iron pills. I have also been loosing urine since I had it put in. It is little dribbles here and there. I did some research on my symptoms and I think they are coming from the Mirena. I just want it removed." Requesting afternoon appointment on a Friday. Appointment scheduled for 5/20 at 2:30pm with Dr.Miller. Patient is agreeable to date and time. Order placed for precert.  Routing to provider for final review. Patient agreeable to disposition. Will close encounter

## 2014-11-17 NOTE — Telephone Encounter (Signed)
Pt would like an appointment to have the mirena removed.

## 2014-11-18 ENCOUNTER — Telehealth: Payer: Self-pay | Admitting: Obstetrics & Gynecology

## 2014-11-18 NOTE — Telephone Encounter (Signed)
Left message for patient to call back. Need to verify coverage information for upcoming appointment.

## 2014-11-18 NOTE — Telephone Encounter (Signed)
Patient returned call and provided updated insurance information. I advised patient that I would verify coverage, but would only call her back if she owed more than a copay for her upcoming appt. Patient agreeable.

## 2014-12-09 ENCOUNTER — Ambulatory Visit (INDEPENDENT_AMBULATORY_CARE_PROVIDER_SITE_OTHER): Payer: BLUE CROSS/BLUE SHIELD | Admitting: Obstetrics & Gynecology

## 2014-12-09 ENCOUNTER — Ambulatory Visit: Payer: BC Managed Care – PPO | Admitting: Nurse Practitioner

## 2014-12-09 VITALS — BP 102/60 | HR 64 | Resp 12 | Wt 117.8 lb

## 2014-12-09 DIAGNOSIS — R5383 Other fatigue: Secondary | ICD-10-CM | POA: Diagnosis not present

## 2014-12-09 DIAGNOSIS — Z30432 Encounter for removal of intrauterine contraceptive device: Secondary | ICD-10-CM

## 2014-12-09 NOTE — Progress Notes (Signed)
Subjective:     Patient ID: Desiree Garcia, female   DOB: 1966/07/11, 49 y.o.   MRN: [id number]  HPI 49 yo G2P2 MWF here for possible IUD removal.  Has had the IUD for two years and this was placed for menorrhagia.  Bleeding is much better but pt reports she has just felt an incredible amount of fatigue since the IUD was placed.  She feels this MUST have something to do with the fatigue.  Has not really had any additional evaluation.  Denies CP, SOB, bowel or bladder changes, muscle weakness, edema.  She just feels tired all the time.  Reports no work or personal changes.  Review of Systems  All other systems reviewed and are negative.      Objective:   Physical Exam  Abdominal: Soft. Bowel sounds are normal. She exhibits no distension. There is no tenderness. There is no rebound.  Genitourinary: Vagina normal. There is no rash, tenderness or lesion on the right labia. There is no rash, tenderness or lesion on the left labia. Uterus is enlarged (at 8-10 weeks size, no change). Uterus is not deviated, not fixed and not tender. Cervix exhibits no motion tenderness, no discharge and no friability. Right adnexum displays no mass, no tenderness and no fullness. Left adnexum displays no mass, no tenderness and no fullness.  2cm IUD string noted and grasped with ringed forceps.  IUD removed with one pull.  Pt tolerated procedure well.    Lymphadenopathy:       Right: No inguinal adenopathy present.       Left: No inguinal adenopathy present.       Assessment:     Fatigue Desires IUD removal.  Pt aware I am not sure this is related but she still desires removal.     Plan:     Mirena IUD removed today FSH, Vit D, and TSH with panel

## 2014-12-10 LAB — THYROID PANEL WITH TSH
Free Thyroxine Index: 1.9 (ref 1.4–3.8)
T3 Uptake: 30 % (ref 22–35)
T4 TOTAL: 6.3 ug/dL (ref 4.5–12.0)
TSH: 0.849 u[IU]/mL (ref 0.350–4.500)

## 2014-12-10 LAB — VITAMIN D 25 HYDROXY (VIT D DEFICIENCY, FRACTURES): Vit D, 25-Hydroxy: 42 ng/mL (ref 30–100)

## 2014-12-10 LAB — FOLLICLE STIMULATING HORMONE: FSH: 3.7 m[IU]/mL

## 2014-12-12 ENCOUNTER — Telehealth: Payer: Self-pay

## 2014-12-12 NOTE — Telephone Encounter (Signed)
Lmtcb//kn 

## 2014-12-12 NOTE — Telephone Encounter (Signed)
-----   Message from Jerene BearsMary S Miller, MD sent at 12/12/2014  6:11 AM EDT ----- Please inform pt her TSH and panel was all in the normal range, she is not in menopause yet with her FSH at 3.7, and her Vit T was good at 42.  I would very much like an update from her in another 2-4 weeks.  She should expect her cycle to return so if this is significant, we can talk about other options like an ablation, for example.

## 2014-12-12 NOTE — Telephone Encounter (Signed)
Patient notified of results: see result note

## 2014-12-22 ENCOUNTER — Telehealth: Payer: Self-pay | Admitting: Obstetrics & Gynecology

## 2014-12-22 NOTE — Telephone Encounter (Signed)
Patient states she wants to cancel aex appointment. Patient stats she has some questions how often should she come in and get a check up. Patient ok for call back

## 2014-12-22 NOTE — Telephone Encounter (Signed)
Left message to call Kaitlyn at [phone]. 

## 2014-12-27 ENCOUNTER — Ambulatory Visit: Payer: BC Managed Care – PPO | Admitting: Obstetrics & Gynecology

## 2014-12-29 NOTE — Telephone Encounter (Signed)
Left message to call Kaitlyn at [phone]. 

## 2015-01-04 NOTE — Telephone Encounter (Signed)
Left message to call Kaitlyn at [phone]. 

## 2015-01-04 NOTE — Telephone Encounter (Signed)
Spoke with patient. Patient states she had to cancel her aex due to having to work the day of appointment. Patient states "I have been in so many times this year. I was seen for a biopsy, an IUD insertion, and then and IUD removal. I was wondering if I could wait until the first part of next year for my annual if that is okay with Dr.Miller." States she is doing well after Mirena removal. Denies any current problems or concerns. Just scheduled mammogram appointment for 01/2015. Advised will speak with Dr.Miller regarding recommendations and return call. Patient is agreeable.

## 2015-01-04 NOTE — Telephone Encounter (Signed)
Yes.  That is totally fine if you can find a spot for.  OK to change AEX to 6 months from time scheduled if works for pt.

## 2015-01-05 NOTE — Telephone Encounter (Signed)
Left message to call Kaitlyn at [phone]. 

## 2015-01-10 NOTE — Telephone Encounter (Signed)
Left message to call Desiree Garcia at [phone]. 

## 2015-01-16 NOTE — Telephone Encounter (Signed)
Left message to call Kaitlyn at [phone]. 

## 2015-01-16 NOTE — Telephone Encounter (Signed)
Spoke with patient. Advised of message as seen below from Dr.Miller. Patient is agreeable. Aex scheduled for 08/04/2015 at 2:30pm with Dr.Miller. Agreeable to date and time.  Routing to provider for final review. Patient agreeable to disposition. Will close encounter.

## 2015-04-10 ENCOUNTER — Other Ambulatory Visit: Payer: Self-pay

## 2015-04-10 DIAGNOSIS — Z1231 Encounter for screening mammogram for malignant neoplasm of breast: Secondary | ICD-10-CM

## 2015-05-29 ENCOUNTER — Ambulatory Visit
Admission: RE | Admit: 2015-05-29 | Discharge: 2015-05-29 | Disposition: A | Discharge status: Home / Self Care | Payer: BLUE CROSS/BLUE SHIELD | Source: Ambulatory Visit

## 2015-05-29 DIAGNOSIS — Z1231 Encounter for screening mammogram for malignant neoplasm of breast: Secondary | ICD-10-CM

## 2015-08-04 ENCOUNTER — Ambulatory Visit (INDEPENDENT_AMBULATORY_CARE_PROVIDER_SITE_OTHER): Payer: BLUE CROSS/BLUE SHIELD | Admitting: Obstetrics & Gynecology

## 2015-08-04 ENCOUNTER — Encounter: Payer: Self-pay | Admitting: Obstetrics & Gynecology

## 2015-08-04 VITALS — BP 100/60 | HR 66 | Resp 12 | Ht 61.0 in | Wt 119.0 lb

## 2015-08-04 DIAGNOSIS — Z124 Encounter for screening for malignant neoplasm of cervix: Secondary | ICD-10-CM | POA: Diagnosis not present

## 2015-08-04 DIAGNOSIS — Z01419 Encounter for gynecological examination (general) (routine) without abnormal findings: Secondary | ICD-10-CM

## 2015-08-04 NOTE — Progress Notes (Signed)
50 y.o. G2P2 MarriedCaucasianF here for annual exam.  Pt reports her fatigue is much better with the IUD removed and that cycles are better this year.  They are still regular.  Flow lasts 5-7 days with three days of real bleeding.  Clotting is better.  Starting to have some night sweats.  Denies hot flashes during the day.    PCP:  Dr. Clelia CroftShaw.  Will see him this year and have blood work.    Patient's last menstrual period was 07/22/2015.          Sexually active: Yes.    The current method of family planning is vasectomy.    Exercising: No.  The patient does not participate in regular exercise at present. Smoker:  no  Health Maintenance: Pap:  08/16/11 Neg. HR HPV:neg History of abnormal Pap:  no MMG:  05/30/15 BIRADS1:neg Colonoscopy:  Never BMD:   Never TDaP:  2013  Screening Labs: PCP, Hb today: PCP, Urine today: PCP   reports that she has never smoked. She has never used smokeless tobacco. She reports that she drinks about 1.8 oz of alcohol per week. She reports that she does not use illicit drugs.  Past Medical History  Diagnosis Date  . Anxiety   . Menorrhagia   . PMS (premenstrual syndrome)   . Anemia     Past Surgical History  Procedure Laterality Date  . Wisdom tooth extraction Bilateral age 50  . Intrauterine device (iud) insertion  12/21/13    Current Outpatient Prescriptions  Medication Sig Dispense Refill  . escitalopram (LEXAPRO) 10 MG tablet Take 10 mg by mouth daily.  5  . FERREX 150 150 MG capsule Take 150 mg by mouth daily.  5  . Calcium Carbonate-Vitamin D (CALCIUM + D PO) Take by mouth. Reported on 08/04/2015    . Multiple Vitamin (MULTIVITAMIN) capsule Take 1 capsule by mouth daily. Reported on 08/04/2015     No current facility-administered medications for this visit.    Family History  Problem Relation Age of Onset  . Hyperlipidemia Mother   . Hypertension Mother   . Cancer - Lung Mother 2272    stage IV non small cell, chemo starting 5/14  . COPD  Father     ROS:  Pertinent items are noted in HPI.  Otherwise, a comprehensive ROS was negative.  Exam:   BP 100/60 mmHg  Pulse 66  Resp 12  Ht 5\' 1"  (1.549 m)  Wt 119 lb (53.978 kg)  BMI 22.50 kg/m2  LMP 07/22/2015  Weight change: +7#   Height: 5\' 1"  (154.9 cm)  Ht Readings from Last 3 Encounters:  08/04/15 5\' 1"  (1.549 m)  02/18/14 5' 4.25" (1.632 m)  12/17/13 5' 4.25" (1.632 m)    General appearance: alert, cooperative and appears stated age Head: Normocephalic, without obvious abnormality, atraumatic Neck: no adenopathy, supple, symmetrical, trachea midline and thyroid normal to inspection and palpation Lungs: clear to auscultation bilaterally Breasts: normal appearance, no masses or tenderness Heart: regular rate and rhythm Abdomen: soft, non-tender; bowel sounds normal; no masses,  no organomegaly Extremities: extremities normal, atraumatic, no cyanosis or edema Skin: Skin color, texture, turgor normal. No rashes or lesions Lymph nodes: Cervical, supraclavicular, and axillary nodes normal. No abnormal inguinal nodes palpated Neurologic: Grossly normal   Pelvic: External genitalia:  no lesions              Urethra:  normal appearing urethra with no masses, tenderness or lesions  Bartholins and Skenes: normal                 Vagina: normal appearing vagina with normal color and discharge, no lesions              Cervix: no lesions              Pap taken: Yes.   Bimanual Exam:  Uterus:  normal size, contour, position, consistency, mobility, non-tender              Adnexa: normal adnexa and no mass, fullness, tenderness               Rectovaginal: Confirms               Anus:  normal sphincter tone, no lesions  Chaperone was present for exam.  A:  Well Woman with normal exam History of menorrhagia that is improved this past year  H/o iron def anemia   P:  Mammogram yearly Labs and vaccine with Dr. Clelia Croft.  Has appt later  this year. Pap with HR HPV Return annually or prn

## 2015-08-08 LAB — IPS PAP TEST WITH HPV

## 2015-12-01 DIAGNOSIS — H5201 Hypermetropia, right eye: Secondary | ICD-10-CM | POA: Diagnosis not present

## 2015-12-15 DIAGNOSIS — Z0000 Encounter for general adult medical examination without abnormal findings: Secondary | ICD-10-CM | POA: Diagnosis not present

## 2015-12-20 DIAGNOSIS — Z1212 Encounter for screening for malignant neoplasm of rectum: Secondary | ICD-10-CM | POA: Diagnosis not present

## 2015-12-22 DIAGNOSIS — D508 Other iron deficiency anemias: Secondary | ICD-10-CM | POA: Diagnosis not present

## 2015-12-22 DIAGNOSIS — Z0000 Encounter for general adult medical examination without abnormal findings: Secondary | ICD-10-CM | POA: Diagnosis not present

## 2015-12-22 DIAGNOSIS — Z1389 Encounter for screening for other disorder: Secondary | ICD-10-CM | POA: Diagnosis not present

## 2015-12-22 DIAGNOSIS — Z6822 Body mass index (BMI) 22.0-22.9, adult: Secondary | ICD-10-CM | POA: Diagnosis not present

## 2015-12-22 DIAGNOSIS — F411 Generalized anxiety disorder: Secondary | ICD-10-CM | POA: Diagnosis not present

## 2016-01-08 ENCOUNTER — Encounter: Payer: Self-pay | Admitting: Internal Medicine

## 2016-03-14 DIAGNOSIS — R769 Abnormal immunological finding in serum, unspecified: Secondary | ICD-10-CM | POA: Diagnosis not present

## 2016-03-26 ENCOUNTER — Ambulatory Visit (AMBULATORY_SURGERY_CENTER): Payer: Self-pay | Admitting: *Deleted

## 2016-03-26 VITALS — Ht 61.0 in | Wt 120.0 lb

## 2016-03-26 DIAGNOSIS — Z1211 Encounter for screening for malignant neoplasm of colon: Secondary | ICD-10-CM

## 2016-03-26 MED ORDER — NA SULFATE-K SULFATE-MG SULF 17.5-3.13-1.6 GM/177ML PO SOLN: 1.0000 | Freq: Once | ORAL | 0 refills | Status: AC

## 2016-03-26 NOTE — Progress Notes (Signed)
No egg or soy allergy known to patient  No issues with past sedation with any surgeries  or procedures, no intubation problems  No diet pills per patient No home 02 use per patient  No blood thinners per patient  Pt denies issues with constipation  No A fib or A flutter   

## 2016-03-27 ENCOUNTER — Encounter: Payer: Self-pay | Admitting: Internal Medicine

## 2016-04-09 ENCOUNTER — Encounter: Payer: Self-pay | Admitting: Internal Medicine

## 2016-04-09 ENCOUNTER — Ambulatory Visit (AMBULATORY_SURGERY_CENTER): Payer: BLUE CROSS/BLUE SHIELD | Admitting: Internal Medicine

## 2016-04-09 VITALS — BP 124/56 | HR 62 | Temp 98.4°F | Resp 17 | Ht 61.0 in | Wt 119.0 lb

## 2016-04-09 DIAGNOSIS — Z1211 Encounter for screening for malignant neoplasm of colon: Secondary | ICD-10-CM | POA: Diagnosis not present

## 2016-04-09 MED ORDER — SODIUM CHLORIDE 0.9 % IV SOLN: 500.0000 mL | INTRAVENOUS | Status: DC

## 2016-04-09 NOTE — Patient Instructions (Signed)
YOU HAD AN ENDOSCOPIC PROCEDURE TODAY AT THE [facility]:   Refer to the procedure report that was given to you for any specific questions about what was found during the examination.  If the procedure report does not answer your questions, please call your gastroenterologist to clarify.  If you requested that your care partner not be given the details of your procedure findings, then the procedure report has been included in a sealed envelope for you to review at your convenience later.  YOU SHOULD EXPECT: Some feelings of bloating in the abdomen. Passage of more gas than usual.  Walking can help get rid of the air that was put into your GI tract during the procedure and reduce the bloating. If you had a lower endoscopy (such as a colonoscopy or flexible sigmoidoscopy) you may notice spotting of blood in your stool or on the toilet paper. If you underwent a bowel prep for your procedure, you may not have a normal bowel movement for a few days.  Please Note:  You might notice some irritation and congestion in your nose or some drainage.  This is from the oxygen used during your procedure.  There is no need for concern and it should clear up in a day or so.  SYMPTOMS TO REPORT IMMEDIATELY:   Following lower endoscopy (colonoscopy or flexible sigmoidoscopy):  Excessive amounts of blood in the stool  Significant tenderness or worsening of abdominal pains  Swelling of the abdomen that is new, acute  Fever of 100F or higher   Following upper endoscopy (EGD)  Vomiting of blood or coffee ground material  New chest pain or pain under the shoulder blades  Painful or persistently difficult swallowing  New shortness of breath  Fever of 100F or higher  Black, tarry-looking stools  For urgent or emergent issues, a gastroenterologist can be reached at any hour by calling [phone].   DIET:  We do recommend a small meal at first, but then you may proceed to your regular diet.  Drink  plenty of fluids but you should avoid alcoholic beverages for 24 hours.  ACTIVITY:  You should plan to take it easy for the rest of today and you should NOT DRIVE or use heavy machinery until tomorrow (because of the sedation medicines used during the test).    FOLLOW UP: Our staff will call the number listed on your records the next business day following your procedure to check on you and address any questions or concerns that you may have regarding the information given to you following your procedure. If we do not reach you, we will leave a message.  However, if you are feeling well and you are not experiencing any problems, there is no need to return our call.  We will assume that you have returned to your regular daily activities without incident.  If any biopsies were taken you will be contacted by phone or by letter within the next 1-3 weeks.  Please call us at [phone] if you have not heard about the biopsies in 3 weeks.    SIGNATURES/CONFIDENTIALITY: You and/or your care partner have signed paperwork which will be entered into your electronic medical record.  These signatures attest to the fact that that the information above on your After Visit Summary has been reviewed and is understood.  Full responsibility of the confidentiality of this discharge information lies with you and/or your care-partner.  Follow up colonoscopy 10 years-2027

## 2016-04-09 NOTE — Progress Notes (Signed)
To recovery report to RN

## 2016-04-09 NOTE — Op Note (Signed)
[facility] Patient Name: Desiree PaisRae Glace Procedure Date: 04/09/2016 10:39 AM MRN: [id number] Endoscopist: Beverley FiedlerJay M Glade Strausser , MD Age: 50 Referring MD:  Date of Birth: December 27, 1965 Gender: Female Account #: [id number] Procedure:                Colonoscopy Indications:              Screening for colorectal malignant neoplasm, This                            is the patient's first colonoscopy Medicines:                Monitored Anesthesia Care Procedure:                Pre-Anesthesia Assessment:                           - Prior to the procedure, a History and Physical                            was performed, and patient medications and                            allergies were reviewed. The patient's tolerance of                            previous anesthesia was also reviewed. The risks                            and benefits of the procedure and the sedation                            options and risks were discussed with the patient.                            All questions were answered, and informed consent                            was obtained. Prior Anticoagulants: The patient has                            taken no previous anticoagulant or antiplatelet                            agents. ASA Grade Assessment: II - A patient with                            mild systemic disease. After reviewing the risks                            and benefits, the patient was deemed in                            satisfactory condition to undergo the procedure.  After obtaining informed consent, the colonoscope                            was passed under direct vision. Throughout the                            procedure, the patient's blood pressure, pulse, and                            oxygen saturations were monitored continuously. The                            Model PCF-H190L [id number]) scope was introduced                            through the anus and advanced  to the the cecum,                            identified by appendiceal orifice and ileocecal                            valve. The colonoscopy was performed without                            difficulty. The patient tolerated the procedure                            well. The quality of the bowel preparation was                            excellent. The ileocecal valve, appendiceal                            orifice, and rectum were photographed. Scope In: 11:06:34 AM Scope Out: 11:20:59 AM Scope Withdrawal Time: 0 hours 10 minutes 45 seconds  Total Procedure Duration: 0 hours 14 minutes 25 seconds  Findings:                 The perianal and digital rectal examinations were                            normal.                           The entire examined colon appeared normal on direct                            and retroflexion views. Complications:            No immediate complications. Estimated Blood Loss:     Estimated blood loss: none. Impression:               - The entire examined colon is normal on direct and                            retroflexion views.                           -  No specimens collected. Recommendation:           - Patient has a contact number available for                            emergencies. The signs and symptoms of potential                            delayed complications were discussed with the                            patient. Return to normal activities tomorrow.                            Written discharge instructions were provided to the                            patient.                           - Resume previous diet.                           - Continue present medications.                           - Repeat colonoscopy in 10 years for screening                            purposes. Beverley Fiedler, MD 04/09/2016 11:24:08 AM This report has been signed electronically.

## 2016-04-10 ENCOUNTER — Telehealth: Payer: Self-pay | Admitting: *Deleted

## 2016-04-10 NOTE — Telephone Encounter (Signed)
Message left

## 2016-11-22 ENCOUNTER — Ambulatory Visit: Payer: BLUE CROSS/BLUE SHIELD | Admitting: Obstetrics & Gynecology

## 2016-11-26 ENCOUNTER — Other Ambulatory Visit: Payer: Self-pay | Admitting: Obstetrics & Gynecology

## 2016-11-26 DIAGNOSIS — Z1231 Encounter for screening mammogram for malignant neoplasm of breast: Secondary | ICD-10-CM

## 2016-12-04 DIAGNOSIS — H5201 Hypermetropia, right eye: Secondary | ICD-10-CM | POA: Diagnosis not present

## 2016-12-12 DIAGNOSIS — R5383 Other fatigue: Secondary | ICD-10-CM | POA: Diagnosis not present

## 2016-12-20 ENCOUNTER — Ambulatory Visit
Admission: RE | Admit: 2016-12-20 | Discharge: 2016-12-20 | Disposition: A | Discharge status: Home / Self Care | Payer: BLUE CROSS/BLUE SHIELD | Source: Ambulatory Visit | Attending: Obstetrics & Gynecology | Admitting: Obstetrics & Gynecology

## 2016-12-20 DIAGNOSIS — Z1231 Encounter for screening mammogram for malignant neoplasm of breast: Secondary | ICD-10-CM

## 2016-12-24 ENCOUNTER — Other Ambulatory Visit: Payer: Self-pay | Admitting: Obstetrics & Gynecology

## 2016-12-24 DIAGNOSIS — R928 Other abnormal and inconclusive findings on diagnostic imaging of breast: Secondary | ICD-10-CM

## 2016-12-25 ENCOUNTER — Other Ambulatory Visit: Payer: Self-pay | Admitting: *Deleted

## 2016-12-25 ENCOUNTER — Telehealth: Payer: Self-pay | Admitting: *Deleted

## 2016-12-25 DIAGNOSIS — R928 Other abnormal and inconclusive findings on diagnostic imaging of breast: Secondary | ICD-10-CM

## 2016-12-25 NOTE — Telephone Encounter (Signed)
I signed the orders.  Encounter closed.  Cc- Dr. Hyacinth MeekerMiller

## 2016-12-25 NOTE — Telephone Encounter (Signed)
[facility] order for diagnostic MMG right breast and right breast ultrasound be signed by Dr. Hyacinth MeekerMiller. Advised Dr. Hyacinth MeekerMiller is out of the office today, will place new order for covering provider to sign. Patient is scheduled for 12/26/16.   Routing to covering provider, Dr. Edward JollySilva.  Cc: Dr. Hyacinth MeekerMiller

## 2016-12-26 ENCOUNTER — Ambulatory Visit
Admission: RE | Admit: 2016-12-26 | Discharge: 2016-12-26 | Disposition: A | Discharge status: Home / Self Care | Payer: BLUE CROSS/BLUE SHIELD | Source: Ambulatory Visit | Attending: Obstetrics & Gynecology | Admitting: Obstetrics & Gynecology

## 2016-12-26 DIAGNOSIS — R928 Other abnormal and inconclusive findings on diagnostic imaging of breast: Secondary | ICD-10-CM

## 2016-12-26 DIAGNOSIS — R922 Inconclusive mammogram: Secondary | ICD-10-CM | POA: Diagnosis not present

## 2017-01-09 DIAGNOSIS — Z0000 Encounter for general adult medical examination without abnormal findings: Secondary | ICD-10-CM | POA: Diagnosis not present

## 2017-01-14 DIAGNOSIS — Z0000 Encounter for general adult medical examination without abnormal findings: Secondary | ICD-10-CM | POA: Diagnosis not present

## 2017-01-14 DIAGNOSIS — Z1389 Encounter for screening for other disorder: Secondary | ICD-10-CM | POA: Diagnosis not present

## 2017-01-14 DIAGNOSIS — D509 Iron deficiency anemia, unspecified: Secondary | ICD-10-CM | POA: Diagnosis not present

## 2017-01-14 DIAGNOSIS — R012 Other cardiac sounds: Secondary | ICD-10-CM | POA: Diagnosis not present

## 2017-01-14 DIAGNOSIS — R0609 Other forms of dyspnea: Secondary | ICD-10-CM | POA: Diagnosis not present

## 2017-01-14 DIAGNOSIS — R5383 Other fatigue: Secondary | ICD-10-CM | POA: Diagnosis not present

## 2017-01-15 DIAGNOSIS — Z1212 Encounter for screening for malignant neoplasm of rectum: Secondary | ICD-10-CM | POA: Diagnosis not present

## 2017-01-15 LAB — IFOBT (OCCULT BLOOD): IMMUNOLOGICAL FECAL OCCULT BLOOD TEST: NEGATIVE

## 2017-01-17 ENCOUNTER — Ambulatory Visit: Payer: BLUE CROSS/BLUE SHIELD | Admitting: Obstetrics & Gynecology

## 2017-03-07 ENCOUNTER — Ambulatory Visit: Payer: BLUE CROSS/BLUE SHIELD | Admitting: Obstetrics & Gynecology

## 2017-06-26 DIAGNOSIS — D508 Other iron deficiency anemias: Secondary | ICD-10-CM | POA: Diagnosis not present

## 2017-09-01 ENCOUNTER — Encounter: Payer: Self-pay | Admitting: Obstetrics & Gynecology

## 2017-09-01 ENCOUNTER — Ambulatory Visit: Payer: BLUE CROSS/BLUE SHIELD | Admitting: Obstetrics & Gynecology

## 2017-09-01 ENCOUNTER — Other Ambulatory Visit: Payer: Self-pay

## 2017-09-01 VITALS — BP 128/62 | HR 64 | Resp 12 | Ht 60.75 in | Wt 123.0 lb

## 2017-09-01 DIAGNOSIS — R61 Generalized hyperhidrosis: Secondary | ICD-10-CM

## 2017-09-01 DIAGNOSIS — N920 Excessive and frequent menstruation with regular cycle: Secondary | ICD-10-CM

## 2017-09-01 DIAGNOSIS — Z01419 Encounter for gynecological examination (general) (routine) without abnormal findings: Secondary | ICD-10-CM

## 2017-09-01 MED ORDER — NORETHINDRONE 0.35 MG PO TABS: 1.0000 | ORAL_TABLET | Freq: Every day | ORAL | 3 refills | Status: DC

## 2017-09-01 NOTE — Progress Notes (Signed)
52 y.o. G2P2 Married Caucasian F here for annual exam.  Doing well.  Still cycling.  Flow lasts 7-9 days.  Does have two to three days that are heavy.  Passes clots.  Has some night sweats.  Was hoping cycles would be much improved by this point.  Has an older sister who is 19 and is still cycling.  Reports Dr. Clelia Croft has been following her hemoglobin and iron levels.  She reports her ferritin is still low despite being on iron for several months.  I am going to try and get the lab results.  We discussed iron transfusions today.  Patient's last menstrual period was 08/31/2017.          Sexually active: Yes.    The current method of family planning is vasectomy.    Exercising: Yes.    walking Smoker:  no  Health Maintenance: Pap:  08/04/15 negative, HR HPV negative, 08/16/11 negative, HR HPV negative   History of abnormal Pap:  no MMG:  12/26/16 Diagnostic Right BIRADS 1 negative  Colonoscopy:  04/09/16 normal, repeat 10 years, Dr. Rhea Belton BMD:   never TDaP:  2013  Pneumonia vaccine(s):  no Shingrix:   no Hep C testing: not indicated  Screening Labs: PCP, Hb today: PCP, Urine today: not collected    reports that  has never smoked. she has never used smokeless tobacco. She reports that she drinks about 1.8 oz of alcohol per week. She reports that she does not use drugs.  Past Medical History:  Diagnosis Date  . Allergy    seasaonal  . Anemia   . Anxiety   . Menorrhagia   . PMS (premenstrual syndrome)     Past Surgical History:  Procedure Laterality Date  . INTRAUTERINE DEVICE (IUD) INSERTION  12/21/13  . WISDOM TOOTH EXTRACTION Bilateral age 22    Current Outpatient Medications  Medication Sig Dispense Refill  . Calcium Carbonate-Vitamin D (CALCIUM + D PO) Take by mouth. Reported on 08/04/2015    . escitalopram (LEXAPRO) 10 MG tablet Take 5 mg by mouth daily.   5  . IRON PO Take by mouth daily.    . Multiple Vitamin (MULTIVITAMIN) capsule Take 1 capsule by mouth daily. Reported on  08/04/2015     No current facility-administered medications for this visit.     Family History  Problem Relation Age of Onset  . Hyperlipidemia Mother   . Hypertension Mother   . Cancer - Lung Mother 49       stage IV non small cell, chemo starting 5/14  . COPD Father   . Colon polyps Father   . Colon cancer Neg Hx   . Rectal cancer Neg Hx   . Stomach cancer Neg Hx     ROS:  Pertinent items are noted in HPI.  Otherwise, a comprehensive ROS was negative.  Exam:   BP 128/62 (BP Location: Right Arm, Patient Position: Sitting, Cuff Size: Normal)   Pulse 64   Resp 12   Ht 5' 0.75" (1.543 m)   Wt 123 lb (55.8 kg)   LMP 08/31/2017   BMI 23.43 kg/m    Height: 5' 0.75" (154.3 cm)  Ht Readings from Last 3 Encounters:  09/01/17 5' 0.75" (1.543 m)  04/09/16 5\' 1"  (1.549 m)  03/26/16 5\' 1"  (1.549 m)    General appearance: alert, cooperative and appears stated age Head: Normocephalic, without obvious abnormality, atraumatic Neck: no adenopathy, supple, symmetrical, trachea midline and thyroid normal to inspection and palpation Lungs:  clear to auscultation bilaterally Breasts: normal appearance, no masses or tenderness Heart: regular rate and rhythm Abdomen: soft, non-tender; bowel sounds normal; no masses,  no organomegaly Extremities: extremities normal, atraumatic, no cyanosis or edema Skin: Skin color, texture, turgor normal. No rashes or lesions Lymph nodes: Cervical, supraclavicular, and axillary nodes normal. No abnormal inguinal nodes palpated Neurologic: Grossly normal   Pelvic: External genitalia:  no lesions              Urethra:  normal appearing urethra with no masses, tenderness or lesions              Bartholins and Skenes: normal                 Vagina: normal appearing vagina with normal color and discharge, no lesions              Cervix: no lesions              Pap taken: No. Bimanual Exam:  Uterus:  normal size, contour, position, consistency, mobility,  non-tender              Adnexa: normal adnexa and no mass, fullness, tenderness               Rectovaginal: Confirms               Anus:  normal sphincter tone, no lesions  Chaperone was present for exam.  A:  Well Woman with normal exam Vasectomy for birth control Menorrhagia  P:   Mammogram guidelines reviewed.  Doing 3D MMG. pap smear Options for treatment of menorrhagia discussed today.  She is not interested in an IUD at this point.  I would rather not use combination OCPs as well.  Last ultrasound was 6/15 but exam feels the same today.  She decided she would like to try Micronor.  Micronor rx to pharmacy.  Pt is going to give update in 3 months. Release of lab work from Dr. Clelia CroftShaw.  May need to have iron transfusion.  I will can indicate with her the results and will help her with decision about proceeding with iron transfusions once the lab work is reviewed. Return annually or prn

## 2017-09-23 ENCOUNTER — Telehealth: Payer: Self-pay | Admitting: *Deleted

## 2017-09-23 NOTE — Telephone Encounter (Signed)
Left message to call Garyn Waguespack at [phone].  

## 2017-09-23 NOTE — Telephone Encounter (Signed)
-----   Message from Jerene BearsMary S Miller, MD sent at 09/22/2017  5:35 PM EST ----- Regarding: reviewed blood work from Dr. Clelia CroftShaw Can you let pt know I got her lab work results from Dr. Clelia CroftShaw.  Her hb is 10.8 and her iron levels and iron stores are low.  I do think she would benefit from an iron transfusion.  Can we refer her?  Thanks.  Rosalita ChessmanSuzanne

## 2017-09-29 NOTE — Telephone Encounter (Signed)
Left detailed message on [phone], ok per current dpr. Advised as seen below per Dr. Hyacinth MeekerMiller. Return call to office at [phone] to further discuss referral for iron transfusion.

## 2017-10-03 DIAGNOSIS — D508 Other iron deficiency anemias: Secondary | ICD-10-CM | POA: Diagnosis not present

## 2017-10-13 NOTE — Telephone Encounter (Signed)
Routing to Dr. Gloris HamMiller FYI. Left detailed message, no return call. Per review of Epic, patient is scheduled for IV Feraheme  on 10/17/17. Will close encounter.

## 2017-10-14 ENCOUNTER — Encounter (HOSPITAL_COMMUNITY): Payer: BLUE CROSS/BLUE SHIELD

## 2017-10-16 ENCOUNTER — Other Ambulatory Visit (HOSPITAL_COMMUNITY): Payer: Self-pay | Admitting: *Deleted

## 2017-10-17 ENCOUNTER — Ambulatory Visit (HOSPITAL_COMMUNITY)
Admission: RE | Admit: 2017-10-17 | Discharge: 2017-10-17 | Disposition: A | Discharge status: Home / Self Care | Payer: BLUE CROSS/BLUE SHIELD | Source: Ambulatory Visit | Attending: Internal Medicine | Admitting: Internal Medicine

## 2017-10-17 DIAGNOSIS — D649 Anemia, unspecified: Secondary | ICD-10-CM | POA: Diagnosis not present

## 2017-10-17 MED ORDER — SODIUM CHLORIDE 0.9 % IV SOLN
510.0000 mg | INTRAVENOUS | Status: DC
  Administered 2017-10-17: 510 mg via INTRAVENOUS
  Filled 2017-10-17: qty 17

## 2017-10-17 NOTE — Discharge Instructions (Signed)

## 2017-10-24 ENCOUNTER — Ambulatory Visit (HOSPITAL_COMMUNITY)
Admission: RE | Admit: 2017-10-24 | Discharge: 2017-10-24 | Disposition: A | Discharge status: Home / Self Care | Payer: BLUE CROSS/BLUE SHIELD | Source: Ambulatory Visit | Attending: Internal Medicine | Admitting: Internal Medicine

## 2017-10-24 DIAGNOSIS — D649 Anemia, unspecified: Secondary | ICD-10-CM | POA: Diagnosis not present

## 2017-10-24 MED ORDER — SODIUM CHLORIDE 0.9 % IV SOLN
510.0000 mg | INTRAVENOUS | Status: DC
  Administered 2017-10-24: 510 mg via INTRAVENOUS
  Filled 2017-10-24: qty 17

## 2017-11-29 ENCOUNTER — Other Ambulatory Visit: Payer: Self-pay | Admitting: Obstetrics & Gynecology

## 2017-12-01 NOTE — Telephone Encounter (Signed)
Medication refill request: OCP  Last AEX:  09-01-17  Next AEX: 11-20-18  Last MMG (if hormonal medication request): 12-26-16 WNL  Refill authorized: please advise

## 2017-12-10 DIAGNOSIS — D509 Iron deficiency anemia, unspecified: Secondary | ICD-10-CM | POA: Diagnosis not present

## 2018-02-05 ENCOUNTER — Other Ambulatory Visit: Payer: Self-pay | Admitting: Obstetrics & Gynecology

## 2018-02-05 DIAGNOSIS — Z1231 Encounter for screening mammogram for malignant neoplasm of breast: Secondary | ICD-10-CM

## 2018-02-27 ENCOUNTER — Ambulatory Visit
Admission: RE | Admit: 2018-02-27 | Discharge: 2018-02-27 | Disposition: A | Discharge status: Home / Self Care | Payer: BLUE CROSS/BLUE SHIELD | Source: Ambulatory Visit | Attending: Obstetrics & Gynecology | Admitting: Obstetrics & Gynecology

## 2018-02-27 DIAGNOSIS — Z1231 Encounter for screening mammogram for malignant neoplasm of breast: Secondary | ICD-10-CM | POA: Diagnosis not present

## 2018-03-30 DIAGNOSIS — Z0000 Encounter for general adult medical examination without abnormal findings: Secondary | ICD-10-CM | POA: Diagnosis not present

## 2018-04-07 DIAGNOSIS — Z0000 Encounter for general adult medical examination without abnormal findings: Secondary | ICD-10-CM | POA: Diagnosis not present

## 2018-04-07 DIAGNOSIS — Z1389 Encounter for screening for other disorder: Secondary | ICD-10-CM | POA: Diagnosis not present

## 2018-04-07 DIAGNOSIS — R7989 Other specified abnormal findings of blood chemistry: Secondary | ICD-10-CM | POA: Diagnosis not present

## 2018-04-07 DIAGNOSIS — F411 Generalized anxiety disorder: Secondary | ICD-10-CM | POA: Diagnosis not present

## 2018-04-07 DIAGNOSIS — K219 Gastro-esophageal reflux disease without esophagitis: Secondary | ICD-10-CM | POA: Diagnosis not present

## 2018-04-07 DIAGNOSIS — D649 Anemia, unspecified: Secondary | ICD-10-CM | POA: Diagnosis not present

## 2018-04-07 DIAGNOSIS — D508 Other iron deficiency anemias: Secondary | ICD-10-CM | POA: Diagnosis not present

## 2018-04-08 DIAGNOSIS — Z1212 Encounter for screening for malignant neoplasm of rectum: Secondary | ICD-10-CM | POA: Diagnosis not present

## 2018-11-20 ENCOUNTER — Ambulatory Visit: Payer: BLUE CROSS/BLUE SHIELD | Admitting: Obstetrics & Gynecology

## 2019-01-29 DIAGNOSIS — H5201 Hypermetropia, right eye: Secondary | ICD-10-CM | POA: Diagnosis not present

## 2019-04-09 DIAGNOSIS — Z0000 Encounter for general adult medical examination without abnormal findings: Secondary | ICD-10-CM | POA: Diagnosis not present

## 2019-04-09 DIAGNOSIS — Z23 Encounter for immunization: Secondary | ICD-10-CM | POA: Diagnosis not present

## 2019-04-09 DIAGNOSIS — D509 Iron deficiency anemia, unspecified: Secondary | ICD-10-CM | POA: Diagnosis not present

## 2019-04-14 ENCOUNTER — Other Ambulatory Visit: Payer: Self-pay | Admitting: Obstetrics & Gynecology

## 2019-04-14 DIAGNOSIS — R82998 Other abnormal findings in urine: Secondary | ICD-10-CM | POA: Diagnosis not present

## 2019-04-14 DIAGNOSIS — Z1231 Encounter for screening mammogram for malignant neoplasm of breast: Secondary | ICD-10-CM

## 2019-04-16 DIAGNOSIS — K219 Gastro-esophageal reflux disease without esophagitis: Secondary | ICD-10-CM | POA: Diagnosis not present

## 2019-04-16 DIAGNOSIS — D509 Iron deficiency anemia, unspecified: Secondary | ICD-10-CM | POA: Diagnosis not present

## 2019-04-16 DIAGNOSIS — F411 Generalized anxiety disorder: Secondary | ICD-10-CM | POA: Diagnosis not present

## 2019-04-16 DIAGNOSIS — Z0000 Encounter for general adult medical examination without abnormal findings: Secondary | ICD-10-CM | POA: Diagnosis not present

## 2019-04-16 DIAGNOSIS — Z1331 Encounter for screening for depression: Secondary | ICD-10-CM | POA: Diagnosis not present

## 2019-04-30 DIAGNOSIS — Z1212 Encounter for screening for malignant neoplasm of rectum: Secondary | ICD-10-CM | POA: Diagnosis not present

## 2019-05-21 DIAGNOSIS — D509 Iron deficiency anemia, unspecified: Secondary | ICD-10-CM | POA: Diagnosis not present

## 2019-05-21 DIAGNOSIS — D649 Anemia, unspecified: Secondary | ICD-10-CM | POA: Diagnosis not present

## 2019-05-28 ENCOUNTER — Ambulatory Visit
Admission: RE | Admit: 2019-05-28 | Discharge: 2019-05-28 | Disposition: A | Discharge status: Home / Self Care | Payer: BC Managed Care – PPO | Source: Ambulatory Visit | Attending: Obstetrics & Gynecology | Admitting: Obstetrics & Gynecology

## 2019-05-28 ENCOUNTER — Other Ambulatory Visit: Payer: Self-pay

## 2019-05-28 DIAGNOSIS — Z1231 Encounter for screening mammogram for malignant neoplasm of breast: Secondary | ICD-10-CM | POA: Diagnosis not present

## 2019-08-18 ENCOUNTER — Other Ambulatory Visit: Payer: Self-pay

## 2019-08-20 ENCOUNTER — Other Ambulatory Visit (HOSPITAL_COMMUNITY)
Admission: RE | Admit: 2019-08-20 | Discharge: 2019-08-20 | Disposition: A | Discharge status: Home / Self Care | Payer: BC Managed Care – PPO | Source: Ambulatory Visit | Attending: Obstetrics & Gynecology | Admitting: Obstetrics & Gynecology

## 2019-08-20 ENCOUNTER — Other Ambulatory Visit: Payer: Self-pay

## 2019-08-20 ENCOUNTER — Encounter: Payer: Self-pay | Admitting: Obstetrics & Gynecology

## 2019-08-20 ENCOUNTER — Ambulatory Visit (INDEPENDENT_AMBULATORY_CARE_PROVIDER_SITE_OTHER): Payer: BC Managed Care – PPO | Admitting: Obstetrics & Gynecology

## 2019-08-20 VITALS — BP 128/70 | HR 72 | Temp 97.7°F | Resp 10 | Ht 60.75 in | Wt 125.0 lb

## 2019-08-20 DIAGNOSIS — Z124 Encounter for screening for malignant neoplasm of cervix: Secondary | ICD-10-CM | POA: Diagnosis not present

## 2019-08-20 DIAGNOSIS — Z8742 Personal history of other diseases of the female genital tract: Secondary | ICD-10-CM

## 2019-08-20 DIAGNOSIS — Z01419 Encounter for gynecological examination (general) (routine) without abnormal findings: Secondary | ICD-10-CM

## 2019-08-20 NOTE — Progress Notes (Signed)
54 y.o. G2P2 Married White or Caucasian female here for annual exam.  Doing well.  Daughter got married in September.  Had an outside wedding at the [place] [facility].  Daughter is also finishing her RN degree.    Cycles have changed this past year.  Has skipped up to 3 months.  Has experienced some irregular bleeding.  Flow is light.  Having some increased leg aches.  Had blood work in the fall.  Was not having leg aches at that time.    Patient's last menstrual period was 08/01/2019.          Sexually active: Yes.    The current method of family planning is vasectomy.    Exercising: Yes.    walking Smoker:  no  Health Maintenance: Pap:  08/04/15 neg:HR HPV negative  08/16/11 neg:HR HPV negative   History of abnormal Pap:  no MMG:  06/01/19 BIRADS 1 negative/density c Colonoscopy:  04/09/16 normal, repeat 10 years, Dr. Hilarie Fredrickson BMD:   never TDaP:  2013 Pneumonia vaccine(s):  n/a Shingrix:   Discussed today Hep C testing: n/a Screening Labs: PCP   reports that she has never smoked. She has never used smokeless tobacco. She reports current alcohol use of about 3.0 standard drinks of alcohol per week. She reports that she does not use drugs.  Past Medical History:  Diagnosis Date  . Allergy    seasaonal  . Anemia   . Anxiety   . Menorrhagia   . PMS (premenstrual syndrome)     Past Surgical History:  Procedure Laterality Date  . INTRAUTERINE DEVICE (IUD) INSERTION  12/21/13  . WISDOM TOOTH EXTRACTION Bilateral age 77    Current Outpatient Medications  Medication Sig Dispense Refill  . Calcium Carbonate-Vitamin D (CALCIUM + D PO) Take by mouth. Reported on 08/04/2015    . escitalopram (LEXAPRO) 10 MG tablet Take 5 mg by mouth daily.   5  . IRON PO Take by mouth daily.    . Multiple Vitamin (MULTIVITAMIN) capsule Take 1 capsule by mouth daily. Reported on 08/04/2015     No current facility-administered medications for this visit.    Family History  Problem Relation Age  of Onset  . Hyperlipidemia Mother   . Hypertension Mother   . Cancer - Lung Mother 82       stage IV non small cell, chemo starting 5/14  . COPD Father   . Colon polyps Father   . Colon cancer Neg Hx   . Rectal cancer Neg Hx   . Stomach cancer Neg Hx     Review of Systems  All other systems reviewed and are negative.   Exam:   BP 128/70 (BP Location: Right Arm, Patient Position: Sitting, Cuff Size: Normal)   Pulse 72   Temp 97.7 F (36.5 C) (Temporal)   Resp 10   Ht 5' 0.75" (1.543 m)   Wt 125 lb (56.7 kg)   LMP 08/01/2019   BMI 23.81 kg/m     Height: 5' 0.75" (154.3 cm)  Ht Readings from Last 3 Encounters:  08/20/19 5' 0.75" (1.543 m)  10/17/17 5\' 1"  (1.549 m)  09/01/17 5' 0.75" (1.543 m)    General appearance: alert, cooperative and appears stated age Head: Normocephalic, without obvious abnormality, atraumatic Neck: no adenopathy, supple, symmetrical, trachea midline and thyroid normal to inspection and palpation Lungs: clear to auscultation bilaterally Breasts: normal appearance, no masses or tenderness Heart: regular rate and rhythm Abdomen: soft, non-tender; bowel sounds normal;  no masses,  no organomegaly Extremities: extremities normal, atraumatic, no cyanosis or edema Skin: Skin color, texture, turgor normal. No rashes or lesions Lymph nodes: Cervical, supraclavicular, and axillary nodes normal. No abnormal inguinal nodes palpated Neurologic: Grossly normal   Pelvic: External genitalia:  no lesions              Urethra:  normal appearing urethra with no masses, tenderness or lesions              Bartholins and Skenes: normal                 Vagina: normal appearing vagina with normal color and discharge, no lesions              Cervix: no lesions              Pap taken: Yes.   Bimanual Exam:  Uterus:  normal size, contour, position, consistency, mobility, non-tender              Adnexa: normal adnexa and no mass, fullness, tenderness                Rectovaginal: Confirms               Anus:  normal sphincter tone, no lesions  Chaperone, Zenovia Jordan, CMA, was present for exam.  A:  Well Woman with normal exam H/o menorrhagia H/o iron deficiency Work stressors/anxiety, on lexapro  P:   Mammogram guidelines reviewed  pap smear and HR HPV obtained today Colonoscopy UTD BMD not needed yet Iron and ferritin levels obtained today Return annually or prn

## 2019-08-21 LAB — IRON,TIBC AND FERRITIN PANEL
Ferritin: 11 ng/mL — ABNORMAL LOW (ref 15–150)
Iron Saturation: 14 % — ABNORMAL LOW (ref 15–55)
Iron: 59 ug/dL (ref 27–159)
Total Iron Binding Capacity: 429 ug/dL (ref 250–450)
UIBC: 370 ug/dL (ref 131–425)

## 2019-08-23 LAB — CYTOLOGY - PAP
Comment: NEGATIVE
Diagnosis: NEGATIVE
High risk HPV: NEGATIVE

## 2019-08-24 ENCOUNTER — Telehealth: Payer: Self-pay

## 2019-08-24 NOTE — Telephone Encounter (Signed)
Tried calling patient regarding results as seen below. No answer. Left message for patient to call me back at [phone].

## 2019-08-24 NOTE — Telephone Encounter (Signed)
-----   Message from Jerene Bears, MD sent at 08/23/2019  6:50 AM EST ----- Please let pt know both her iron and ferritin are low.  Does she want referral to hematology or take oral iron and recheck in 3 months?

## 2019-08-25 NOTE — Telephone Encounter (Signed)
Patient will continue to take her iron for 3 months. She will follow up with Dr.Shaw.in 3 months. She does not want a referral to hematology.

## 2019-08-26 NOTE — Telephone Encounter (Signed)
Routing to Dr Miller for review.  

## 2019-08-27 NOTE — Telephone Encounter (Signed)
Thanks.  Ok to close encounter. 

## 2019-11-16 DIAGNOSIS — M25552 Pain in left hip: Secondary | ICD-10-CM | POA: Diagnosis not present

## 2019-11-16 DIAGNOSIS — M545 Low back pain: Secondary | ICD-10-CM | POA: Diagnosis not present

## 2019-12-01 DIAGNOSIS — M25552 Pain in left hip: Secondary | ICD-10-CM | POA: Diagnosis not present

## 2019-12-01 DIAGNOSIS — S336XXD Sprain of sacroiliac joint, subsequent encounter: Secondary | ICD-10-CM | POA: Diagnosis not present

## 2019-12-03 DIAGNOSIS — S336XXD Sprain of sacroiliac joint, subsequent encounter: Secondary | ICD-10-CM | POA: Diagnosis not present

## 2019-12-03 DIAGNOSIS — M25552 Pain in left hip: Secondary | ICD-10-CM | POA: Diagnosis not present

## 2019-12-08 DIAGNOSIS — M25552 Pain in left hip: Secondary | ICD-10-CM | POA: Diagnosis not present

## 2019-12-08 DIAGNOSIS — S336XXD Sprain of sacroiliac joint, subsequent encounter: Secondary | ICD-10-CM | POA: Diagnosis not present

## 2020-03-17 DIAGNOSIS — H5203 Hypermetropia, bilateral: Secondary | ICD-10-CM | POA: Diagnosis not present

## 2020-04-12 ENCOUNTER — Other Ambulatory Visit: Payer: Self-pay | Admitting: Obstetrics & Gynecology

## 2020-04-24 DIAGNOSIS — E785 Hyperlipidemia, unspecified: Secondary | ICD-10-CM | POA: Diagnosis not present

## 2020-04-24 DIAGNOSIS — Z0000 Encounter for general adult medical examination without abnormal findings: Secondary | ICD-10-CM | POA: Diagnosis not present

## 2020-04-27 DIAGNOSIS — D509 Iron deficiency anemia, unspecified: Secondary | ICD-10-CM | POA: Diagnosis not present

## 2020-04-27 DIAGNOSIS — Z1389 Encounter for screening for other disorder: Secondary | ICD-10-CM | POA: Diagnosis not present

## 2020-04-27 DIAGNOSIS — Z1331 Encounter for screening for depression: Secondary | ICD-10-CM | POA: Diagnosis not present

## 2020-04-27 DIAGNOSIS — E785 Hyperlipidemia, unspecified: Secondary | ICD-10-CM | POA: Diagnosis not present

## 2020-04-27 DIAGNOSIS — R82998 Other abnormal findings in urine: Secondary | ICD-10-CM | POA: Diagnosis not present

## 2020-04-27 DIAGNOSIS — Z23 Encounter for immunization: Secondary | ICD-10-CM | POA: Diagnosis not present

## 2020-04-27 DIAGNOSIS — D649 Anemia, unspecified: Secondary | ICD-10-CM | POA: Diagnosis not present

## 2020-04-27 DIAGNOSIS — Z0000 Encounter for general adult medical examination without abnormal findings: Secondary | ICD-10-CM | POA: Diagnosis not present

## 2020-05-22 DIAGNOSIS — Z8616 Personal history of COVID-19: Secondary | ICD-10-CM

## 2020-05-22 HISTORY — DX: Personal history of COVID-19: Z86.16

## 2020-06-07 ENCOUNTER — Telehealth: Payer: Self-pay

## 2020-06-07 NOTE — Telephone Encounter (Signed)
Patient's period has been on and off for 24 days. Had not been on in five years.

## 2020-06-07 NOTE — Telephone Encounter (Signed)
Call to patient. Patient states LMP started on 05-15-20 and she has been bleeding off and on since. States bleeding can be light and then will be moderately heavy. Patient states usually changing pad/ panty liner every 2-3 hours and is not saturated when she changes it. Denies cramping, fever, vaginal discharge or odor. No urinary symptoms. Patient states she has a history of anemia and is concerned about the prolonged bleeding since she hasn't had a period in ~5 months. Patient states her iron levels have recently been normal, so she is no longer taking iron supplements. Denies dizziness, light-headedness, fatigue or SOB. Vasectomy for contraception. RN advised OV recommended for further evaluation. Patient agreeable. Patient scheduled for 05-08-20 at 1315. Patient agreeable to date and time of appointment.   Routing to provider and will close encounter.

## 2020-06-08 ENCOUNTER — Ambulatory Visit: Payer: BC Managed Care – PPO | Admitting: Obstetrics and Gynecology

## 2020-06-08 NOTE — Progress Notes (Deleted)
GYNECOLOGY  VISIT   HPI: 54 y.o.   Married  Caucasian  female   G2P2 with No LMP recorded.   here for irregular bleeding. LMP 5 months ago. Patient states began bleeding 05-15-20 and has been bleeding off and on since.    GYNECOLOGIC HISTORY: No LMP recorded. Contraception: Vasectomy Menopausal hormone therapy:  none Last mammogram: 06/01/19 BIRADS 1 negative/density c Last pap smear: 08-04-15 Neg:Neg HR HPV, 08-16-11 Neg:Neg HR HPV        OB History    Gravida  2   Para  2   Term      Preterm      AB      Living  2     SAB      TAB      Ectopic      Multiple      Live Births                 Patient Active Problem List   Diagnosis Date Noted  . Menorrhagia 12/21/2013  . 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 ANXIETY 03/16/2007  . DEPRESSION 03/16/2007  . HEADACHE 03/16/2007    Past Medical History:  Diagnosis Date  . Allergy    seasaonal  . Anemia   . Anxiety   . Menorrhagia   . PMS (premenstrual syndrome)     Past Surgical History:  Procedure Laterality Date  . INTRAUTERINE DEVICE (IUD) INSERTION  12/21/13  . WISDOM TOOTH EXTRACTION Bilateral age 16    Current Outpatient Medications  Medication Sig Dispense Refill  . Calcium Carbonate-Vitamin D (CALCIUM + D PO) Take by mouth. Reported on 08/04/2015    . escitalopram (LEXAPRO) 10 MG tablet Take 5 mg by mouth daily.   5  . IRON PO Take by mouth daily.    . Multiple Vitamin (MULTIVITAMIN) capsule Take 1 capsule by mouth daily. Reported on 08/04/2015     No current facility-administered medications for this visit.     ALLERGIES: Patient has no known allergies.  Family History  Problem Relation Age of Onset  . Hyperlipidemia Mother   . Hypertension Mother   . Cancer - Lung Mother 49       stage IV non small cell, chemo starting 5/14  . COPD Father   . Colon polyps Father   . Colon cancer Neg Hx   . Rectal cancer Neg Hx   . Stomach cancer Neg Hx     Social History   Socioeconomic History  . Marital  status: Married    Spouse name: Not on file  . Number of children: 2  . Years of education: Not on file  . Highest education level: Not on file  Occupational History    Employer: 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 Smoking status: Never Smoker  . Smokeless tobacco: Never Used  Vaping Use  . Vaping Use: Never used  Substance and Sexual Activity  . Alcohol use: Yes    Alcohol/week: 3.0 standard drinks    Types: 3 Glasses of wine per week  . Drug use: No  . Sexual activity: Yes    Partners: Male    Comment: Husband vasectomy  Other Topics Concern  . Not on file  Social History Narrative  . Not on file   Social Determinants of Health   Financial Resource Strain:   . Difficulty of Paying Living Expenses: Not on file  Food Insecurity:   . Worried About Programme researcher, broadcasting/film/video  in the Last Year: Not on file  . Ran Out of Food in the Last Year: Not on file  Transportation Needs:   . Lack of Transportation (Medical): Not on file  . Lack of Transportation (Non-Medical): Not on file  Physical Activity:   . Days of Exercise per Week: Not on file  . Minutes of Exercise per Session: Not on file  Stress:   . Feeling of Stress : Not on file  Social Connections:   . Frequency of Communication with Friends and Family: Not on file  . Frequency of Social Gatherings with Friends and Family: Not on file  . Attends Religious Services: Not on file  . Active Member of Clubs or Organizations: Not on file  . Attends Banker Meetings: Not on file  . Marital Status: Not on file  Intimate Partner Violence:   . Fear of Current or Ex-Partner: Not on file  . Emotionally Abused: Not on file  . Physically Abused: Not on file  . Sexually Abused: Not on file    Review of Systems  PHYSICAL EXAMINATION:    There were no vitals taken for this visit.    General appearance: alert, cooperative and appears stated age Head: Normocephalic, without obvious abnormality,  atraumatic Neck: no adenopathy, supple, symmetrical, trachea midline and thyroid normal to inspection and palpation Lungs: clear to auscultation bilaterally Breasts: normal appearance, no masses or tenderness, No nipple retraction or dimpling, No nipple discharge or bleeding, No axillary or supraclavicular adenopathy Heart: regular rate and rhythm Abdomen: soft, non-tender, no masses,  no organomegaly Extremities: extremities normal, atraumatic, no cyanosis or edema Skin: Skin color, texture, turgor normal. No rashes or lesions Lymph nodes: Cervical, supraclavicular, and axillary nodes normal. No abnormal inguinal nodes palpated Neurologic: Grossly normal  Pelvic: External genitalia:  no lesions              Urethra:  normal appearing urethra with no masses, tenderness or lesions              Bartholins and Skenes: normal                 Vagina: normal appearing vagina with normal color and discharge, no lesions              Cervix: no lesions                Bimanual Exam:  Uterus:  normal size, contour, position, consistency, mobility, non-tender              Adnexa: no mass, fullness, tenderness              Rectal exam: {yes no:[id number]}.  Confirms.              Anus:  normal sphincter tone, no lesions  Chaperone was present for exam.  ASSESSMENT     PLAN     An After Visit Summary was printed and given to the patient.  ______ minutes face to face time of which over 50% was spent in counseling.

## 2020-06-09 ENCOUNTER — Telehealth: Payer: Self-pay

## 2020-06-09 DIAGNOSIS — Z1212 Encounter for screening for malignant neoplasm of rectum: Secondary | ICD-10-CM | POA: Diagnosis not present

## 2020-06-09 NOTE — Telephone Encounter (Signed)
Patient is calling in regards to having irregular bleeding.Patient would like to speak with nurse.

## 2020-06-09 NOTE — Telephone Encounter (Signed)
AEX 07/2019 with SM H/o anemia Hx of Iron infusions x 2 years ago- PCP Dr Clelia Croft managing   +Covid 06/08/20  LMP: May 2021, then 05/14/20 ? Menopausal, no HRT  Spoke with pt. Pt states missing appt yesterday due to + Covid test and thinking today( Friday)  was her appt. Pt states still having vaginal bleeding since calling in about same issue on Wed 11/17. Pt states has increased since she talked with other triage RN on Wed. Pt states changing a soaked super tampon every - 1 hour today with small quarter size clots. Denies feeling dizzy, weak or lightheaded, but having some intermittent heart palpitations. Pt has hx of Anemia and has not been taking Iron supplements, but will restart today. Pt asking if could be fibroids? Pt does not have hx of fibroids, denies abd pain or cramps. Denies fever, chills.   Pt advised to go to ER for heavy bleeding and anemia hx.  Pt states does not want to go to hospital due to +Covid at this time. Pt advised and highly encouraged to seek ER due to heavy bleeding. Pt wanting to know if can have something to take to help with bleeding. Pt made OV with Dr Edward Jolly on 06/21/20 for bleeding issues. Pt Insisted to review with Dr Edward Jolly and return call. Pt states will go to ER if bleeding doesn't get better today.   Routing to Dr Edward Jolly.

## 2020-06-09 NOTE — Telephone Encounter (Signed)
I agree with ER visit if has ongoing heavy menstrual bleeding due to positive Covid test. I will see her next month for an office visit.

## 2020-06-09 NOTE — Telephone Encounter (Signed)
Spoke with pt. Pt given recommendations per Dr Edward Jolly. Pt agreeable to go ER now. Pt advised can go to either [facility], or [facility] for ER. Pt agreeable to go to [facility]. Pt to keep follow up with Dr Edward Jolly on 06/21/20.  Call placed to [facility] and spoke with ER Nurse to advise of pt's coming and to be evaluated by oncall physician.  Routing to Dr Edward Jolly for final review Encounter closed

## 2020-06-10 ENCOUNTER — Emergency Department (HOSPITAL_COMMUNITY)
Admission: EM | Admit: 2020-06-10 | Discharge: 2020-06-10 | Disposition: A | Discharge status: Home / Self Care | Payer: BC Managed Care – PPO | Attending: Emergency Medicine | Admitting: Emergency Medicine

## 2020-06-10 ENCOUNTER — Emergency Department (HOSPITAL_COMMUNITY): Payer: BC Managed Care – PPO

## 2020-06-10 ENCOUNTER — Other Ambulatory Visit: Payer: Self-pay

## 2020-06-10 ENCOUNTER — Encounter (HOSPITAL_COMMUNITY): Payer: Self-pay | Admitting: *Deleted

## 2020-06-10 DIAGNOSIS — N939 Abnormal uterine and vaginal bleeding, unspecified: Secondary | ICD-10-CM | POA: Diagnosis not present

## 2020-06-10 DIAGNOSIS — R42 Dizziness and giddiness: Secondary | ICD-10-CM | POA: Diagnosis not present

## 2020-06-10 DIAGNOSIS — R58 Hemorrhage, not elsewhere classified: Secondary | ICD-10-CM

## 2020-06-10 DIAGNOSIS — R9389 Abnormal findings on diagnostic imaging of other specified body structures: Secondary | ICD-10-CM | POA: Diagnosis not present

## 2020-06-10 DIAGNOSIS — Z8616 Personal history of COVID-19: Secondary | ICD-10-CM | POA: Diagnosis not present

## 2020-06-10 DIAGNOSIS — N8500 Endometrial hyperplasia, unspecified: Secondary | ICD-10-CM | POA: Diagnosis not present

## 2020-06-10 DIAGNOSIS — R5383 Other fatigue: Secondary | ICD-10-CM | POA: Diagnosis not present

## 2020-06-10 DIAGNOSIS — D259 Leiomyoma of uterus, unspecified: Secondary | ICD-10-CM | POA: Diagnosis not present

## 2020-06-10 LAB — BASIC METABOLIC PANEL
Anion gap: 10 (ref 5–15)
BUN: 9 mg/dL (ref 6–20)
CO2: 24 mmol/L (ref 22–32)
Calcium: 8.6 mg/dL — ABNORMAL LOW (ref 8.9–10.3)
Chloride: 102 mmol/L (ref 98–111)
Creatinine, Ser: 0.81 mg/dL (ref 0.44–1.00)
GFR, Estimated: >60 mL/min (ref >60)
Glucose, Bld: 93 mg/dL (ref 70–99)
Potassium: 3.9 mmol/L (ref 3.5–5.1)
Sodium: 136 mmol/L (ref 135–145)

## 2020-06-10 LAB — HCG, QUANTITATIVE, PREGNANCY: hCG, Beta Chain, Quant, S: <1 m[IU]/mL (ref <5)

## 2020-06-10 LAB — TYPE AND SCREEN
ABO/RH(D): A POS
Antibody Screen: NEGATIVE

## 2020-06-10 LAB — CBC
HCT: 39 % (ref 36.0–46.0)
Hemoglobin: 12.9 g/dL (ref 12.0–15.0)
MCH: 32.3 pg (ref 26.0–34.0)
MCHC: 33.1 g/dL (ref 30.0–36.0)
MCV: 97.7 fL (ref 80.0–100.0)
Platelets: 152 10*3/uL (ref 150–400)
RBC: 3.99 MIL/uL (ref 3.87–5.11)
RDW: 11.9 % (ref 11.5–15.5)
WBC: 3.2 10*3/uL — ABNORMAL LOW (ref 4.0–10.5)
nRBC: 0 % (ref 0.0–0.2)

## 2020-06-10 LAB — ABO/RH: ABO/RH(D): A POS

## 2020-06-10 MED ORDER — SODIUM CHLORIDE 0.9 % IV BOLUS
1000.0000 mL | Freq: Once | INTRAVENOUS | Status: AC
  Administered 2020-06-10: 1000 mL via INTRAVENOUS

## 2020-06-10 MED ORDER — MEDROXYPROGESTERONE ACETATE 5 MG PO TABS: 10.0000 mg | ORAL_TABLET | Freq: Every day | ORAL | 0 refills | Status: DC

## 2020-06-10 MED ORDER — ACETAMINOPHEN 325 MG PO TABS
650.0000 mg | ORAL_TABLET | Freq: Once | ORAL | Status: AC
  Administered 2020-06-10: 650 mg via ORAL
  Filled 2020-06-10: qty 2

## 2020-06-10 NOTE — ED Triage Notes (Signed)
No peroid x 6 months however last several months spotting , thurs afternoon started bleeding very heavy changing tampoon and pad every hour, states she hasn't been able to followup with GYN because she is covid positive.

## 2020-06-10 NOTE — Discharge Instructions (Signed)
Begin taking the Provera today.  This will hopefully stop your bleeding. You may experience withdrawal bleeding up to 2 weeks after he stopped this medication. It is important for you to follow-up with your OB/GYN.  I spoke to Dr. Edward Jolly on the phone about your plan of care. Return to the ER if you start to have worsening bleeding, increased lightheadedness, shortness of breath, chest pain or severe abdominal pain.

## 2020-06-10 NOTE — ED Provider Notes (Signed)
[facility] EMERGENCY DEPARTMENT Provider Note   CSN: [id number] Arrival date & time: 06/10/20  7322     History Chief Complaint  Patient presents with  . Covid/vag bleeding    Desiree Garcia is a 54 y.o. female with a past medical history of menorrhagia, anemia presenting to the ED with a chief complaint of vaginal bleeding.  Reports usual monthly cycle until May 2021.  She did not have any cycle up until October 2021.  Since then she has had spotting daily.  She scheduled an appointment with her OB/GYN 2 days ago but unfortunately got diagnosed with Covid last week so was unable to make it.  She states that for the past 2 days she has been having heavy vaginal bleeding, going through 1 pad an hour.  She did have some fatigue and lightheadedness but she is unsure if this is because the bleeding or the Covid infection.  She states that she was anemic in the past, has had iron infusions and felt similar.  She denies any loss of consciousness, chest pain, shortness of breath.  Had some cramping in her lower abdomen yesterday but none currently.  Reports of bloating feeling today.  No vomiting.  No urinary symptoms.  She is not on any hormone therapy.  She thought earlier in the year that she was going through menopause.  HPI     Past Medical History:  Diagnosis Date  . Allergy    seasaonal  . Anemia   . Anxiety   . Menorrhagia   . PMS (premenstrual syndrome)     Patient Active Problem List   Diagnosis Date Noted  . Menorrhagia 12/21/2013  . 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 ANXIETY 03/16/2007  . DEPRESSION 03/16/2007  . HEADACHE 03/16/2007    Past Surgical History:  Procedure Laterality Date  . INTRAUTERINE DEVICE (IUD) INSERTION  12/21/13  . WISDOM TOOTH EXTRACTION Bilateral age 39     OB History    Gravida  2   Para  2   Term      Preterm      AB      Living  2     SAB      TAB      Ectopic      Multiple      Live Births               Family History  Problem Relation Age of Onset  . Hyperlipidemia Mother   . Hypertension Mother   . Cancer - Lung Mother 60       stage IV non small cell, chemo starting 5/14  . COPD Father   . Colon polyps Father   . Colon cancer Neg Hx   . Rectal cancer Neg Hx   . Stomach cancer Neg Hx     Social History   Tobacco Use  . Smoking status: Never Smoker  . Smokeless tobacco: Never Used  Vaping Use  . Vaping Use: Never used  Substance Use Topics  . Alcohol use: Yes    Alcohol/week: 3.0 standard drinks    Types: 3 Glasses of wine per week  . Drug use: No    Home Medications Prior to Admission medications   Medication Sig Start Date End Date Taking? Authorizing Provider  Calcium Carbonate-Vitamin D (CALCIUM + D PO) Take 1 tablet by mouth daily. Reported on 08/04/2015   Yes [provider]  escitalopram (LEXAPRO) 10 MG tablet Take 5 mg by  mouth daily.  11/18/14  Yes [provider]  IRON PO Take 1 tablet by mouth daily.    Yes [provider]  Multiple Vitamin (MULTIVITAMIN) capsule Take 1 capsule by mouth daily. Reported on 08/04/2015   Yes [provider]  medroxyPROGESTERone (PROVERA) 5 MG tablet Take 2 tablets (10 mg total) by mouth daily for 10 days. 06/10/20 06/20/20  Dietrich Pates, PA-C    Allergies    Patient has no known allergies.  Review of Systems   Review of Systems  Constitutional: Positive for fatigue. Negative for appetite change, chills and fever.  HENT: Negative for ear pain, rhinorrhea, sneezing and sore throat.   Eyes: Negative for photophobia and visual disturbance.  Respiratory: Negative for cough, chest tightness, shortness of breath and wheezing.   Cardiovascular: Negative for chest pain and palpitations.  Gastrointestinal: Negative for abdominal pain, blood in stool, constipation, diarrhea, nausea and vomiting.  Genitourinary: Positive for vaginal bleeding. Negative for dysuria, hematuria and urgency.   Musculoskeletal: Negative for myalgias.  Skin: Negative for rash.  Neurological: Negative for dizziness, weakness and light-headedness.    Physical Exam Updated Vital Signs BP 131/72   Pulse 64   Temp 98.4 F (36.9 C) (Oral)   Resp 17   Ht 5\' 1"  (1.549 m)   Wt 59 kg   SpO2 100%   BMI 24.56 kg/m   Physical Exam Vitals and nursing note reviewed. Exam conducted with a chaperone present.  Constitutional:      General: She is not in acute distress.    Appearance: She is well-developed.  HENT:     Head: Normocephalic and atraumatic.     Nose: Nose normal.  Eyes:     General: No scleral icterus.       Right eye: No discharge.        Left eye: No discharge.     Conjunctiva/sclera: Conjunctivae normal.  Cardiovascular:     Rate and Rhythm: Normal rate and regular rhythm.     Heart sounds: Normal heart sounds. No murmur heard.  No friction rub. No gallop.   Pulmonary:     Effort: Pulmonary effort is normal. No respiratory distress.     Breath sounds: Normal breath sounds.  Abdominal:     General: Bowel sounds are normal. There is no distension.     Palpations: Abdomen is soft.     Tenderness: There is no abdominal tenderness. There is no guarding.  Genitourinary:    Vagina: Bleeding (moderate amount) present.  Musculoskeletal:        General: Normal range of motion.     Cervical back: Normal range of motion and neck supple.  Skin:    General: Skin is warm and dry.     Findings: No rash.  Neurological:     Mental Status: She is alert.     Motor: No abnormal muscle tone.     Coordination: Coordination normal.     ED Results / Procedures / Treatments   Labs (all labs ordered are listed, but only abnormal results are displayed) Labs Reviewed  CBC - Abnormal; Notable for the following components:      Result Value   WBC 3.2 (*)    All other components within normal limits  BASIC METABOLIC PANEL - Abnormal; Notable for the following components:   Calcium 8.6 (*)     All other components within normal limits  HCG, QUANTITATIVE, PREGNANCY  TYPE AND SCREEN  ABO/RH    EKG EKG Interpretation  Date/Time:  Saturday June 10 2020 09:46:28 EST Ventricular Rate:  64 PR Interval:    QRS Duration: 84 QT Interval:  420 QTC Calculation: 434 R Axis:   86 Text Interpretation: Sinus rhythm Borderline short PR interval no acute ST/T changes No old tracing to compare Confirmed by Pricilla LovelessGoldston, Scott [id number]) on 06/10/2020 10:14:35 AM   Radiology US PELVIC COMPLETE WITH TRANSVAGINAL  Result Date: 06/10/2020 CLINICAL DATA:  Bleeding. EXAM: TRANSABDOMINAL AND TRANSVAGINAL ULTRASOUND OF PELVIS TECHNIQUE: Both transabdominal and transvaginal ultrasound examinations of the pelvis were performed. Transabdominal technique was performed for global imaging of the pelvis including uterus, ovaries, adnexal regions, and pelvic cul-de-sac. It was necessary to proceed with endovaginal exam following the transabdominal exam to visualize the endometrium and ovaries. COMPARISON:  None FINDINGS: Uterus Measurements: 9.2 x 4.6 x 6.7 cm = volume: 141.77 mL. There is a 2.0 x 1.5 x 1.9 cm posterior uterine body fibroid. Endometrium Thickness: 10.7 mm.  The endometrium is vascular. Right ovary Measurements: 2.3 x 1.1 x 1.9 cm = volume: 2.4 mL. Normal appearance/no adnexal mass. Left ovary Measurements: 3.8 x 2.0 x 1.5 cm = volume: 5.95 mL. Normal appearance/no adnexal mass. Other findings There is a small amount of free fluid in the pelvis. IMPRESSION: 1. The patient's last menstrual cycle was 6 months ago before bleeding began 3 days ago. The endometrial thickness of 10.7 mm would be abnormally thickened if the patient is considered postmenopausal. The endometrium also appears to be more vascular than normal. In the setting of post-menopausal bleeding, endometrial sampling is indicated to exclude carcinoma. If results are benign, sonohysterogram should be considered for focal lesion work-up.  (Ref: Radiological Reasoning: Algorithmic Workup of Abnormal Vaginal Bleeding with Endovaginal Sonography and Sonohysterography. AJR 2008; 604:[id number]) 2. 2 cm uterine fibroid. 3. No other abnormalities. Electronically Signed   By: Gerome Samavid  Williams III M.D   On: 06/10/2020 12:25    Procedures Procedures (including critical care time)  Medications Ordered in ED Medications  sodium chloride 0.9 % bolus 1,000 mL (1,000 mLs Intravenous New Bag/Given 06/10/20 1150)  acetaminophen (TYLENOL) tablet 650 mg (650 mg Oral Given 06/10/20 1149)    ED Course  I have reviewed the triage vital signs and the nursing notes.  Pertinent labs & imaging results that were available during my care of the patient were reviewed by me and considered in my medical decision making (see chart for details).  Clinical Course as of Jun 11 1343  Sat Jun 10, 2020  1048 Hemoglobin: 12.9 [HK]  1243 Provera 10mg  daily for 10 days per Dr. Edward JollySilva.   [HK]    Clinical Course User Index [HK] Dietrich PatesKhatri, Caelan Atchley, PA-C   MDM Rules/Calculators/A&P                          54 year old female with a past medical history of menorrhagia, anemia presenting to the ED with a chief complaint of vaginal bleeding.  Reports usual monthly cycle until May 2021.  Did not have any bleeding until October 2021 and has had spotting daily since then.  She noticed for the past 2 days she has had heavy bleeding and going through about 1 pad an hour and passing clots.  She was scheduled to see her OB/GYN last week but unfortunately due to her Covid infection was asked to reschedule.  She reports some fatigue and lightheadedness but she is unsure if this is due to her bleeding or her Covid infection.  Not currently  being treated for iron deficiency anemia.  Denies any loss of consciousness, abdominal pain, chest pain or shortness of breath.  Pelvic exam with moderate amount of blood in vaginal vault.  Patient denies any vaginal discharge or concern for STDs.  Vital  signs here are within normal limits, she is hemodynamically stable.  CBC with hemoglobin of 12.9, unremarkable.  BMP unremarkable.  hCG is negative.  EKG shows sinus rhythm, no acute ischemic changes.  Pelvic ultrasound shows thickening of the endometrium.  Because she is considered perimenopausal and not postmenopausal, unsure of etiology.  I spoke to Dr. Edward Jolly, the on-call provider for [facility] and also the provider she is supposed to see in December.  She recommends placing her on Provera for 10 days and following up in the office.  She will need further outpatient work-up.  Patient is agreeable to the plan.  She remains hemodynamically stable here.  Return precautions given.   Patient is hemodynamically stable, in NAD, and able to ambulate in the ED. Evaluation does not show pathology that would require ongoing emergent intervention or inpatient treatment. I explained the diagnosis to the patient. Pain has been managed and has no complaints prior to discharge. Patient is comfortable with above plan and is stable for discharge at this time. All questions were answered prior to disposition. Strict return precautions for returning to the ED were discussed. Encouraged follow up with PCP.   An After Visit Summary was printed and given to the patient.   Portions of this note were generated with Scientist, clinical (histocompatibility and immunogenetics). Dictation errors may occur despite best attempts at proofreading.  Final Clinical Impression(s) / ED Diagnoses Final diagnoses:  Abnormal uterine bleeding    Rx / DC Orders ED Discharge Orders         Ordered    medroxyPROGESTERone (PROVERA) 5 MG tablet  Daily        06/10/20 1341           Dietrich Pates, PA-C 06/10/20 1345    Pricilla Loveless, MD 06/11/20 [id number]

## 2020-06-10 NOTE — ED Notes (Signed)
Patient transported to US 

## 2020-06-10 NOTE — ED Notes (Signed)
Pt was dx with Covid on Thursday, has been having vag bleeding, going through 1 super plus pad and tampon every hour.

## 2020-06-12 DIAGNOSIS — Z1152 Encounter for screening for COVID-19: Secondary | ICD-10-CM | POA: Diagnosis not present

## 2020-06-21 ENCOUNTER — Ambulatory Visit (INDEPENDENT_AMBULATORY_CARE_PROVIDER_SITE_OTHER): Payer: BC Managed Care – PPO | Admitting: Obstetrics and Gynecology

## 2020-06-21 ENCOUNTER — Encounter: Payer: Self-pay | Admitting: Obstetrics and Gynecology

## 2020-06-21 ENCOUNTER — Other Ambulatory Visit: Payer: Self-pay

## 2020-06-21 VITALS — BP 122/72 | HR 72 | Resp 16 | Ht 60.75 in | Wt 129.0 lb

## 2020-06-21 DIAGNOSIS — R9389 Abnormal findings on diagnostic imaging of other specified body structures: Secondary | ICD-10-CM | POA: Diagnosis not present

## 2020-06-21 DIAGNOSIS — N921 Excessive and frequent menstruation with irregular cycle: Secondary | ICD-10-CM

## 2020-06-21 NOTE — Patient Instructions (Signed)
Sonohysterogram  A sonohysterogram is a procedure to examine the inside of the uterus. This exam uses sound waves that are sent to a computer to make images of the lining of the uterus (endometrium). To get the best images, a germ-free, salt-water solution (sterile saline) is put into the uterus through the vagina. You may have this procedure if you have certain reproductive problems, such as abnormal bleeding, infertility, or miscarriage. This procedure can show what may be causing these problems. Possible causes include scarring or abnormal growths such as fibroids inside your uterus. It can also show if your uterus is an abnormal shape or if the lining of the uterus is too thin. Tell a health care provider about:  All medicines you are taking, including vitamins, herbs, eye drops, creams, and over-the-counter medicines.  Any allergies you have.  Any blood disorders you have.  Any surgeries you have had.  Any medical conditions you have.  Whether you are pregnant or may be pregnant.  The date of the first day of your last period.  Any signs of infection, such as fever, pain in your lower abdomen, or abnormal discharge from your vagina. What are the risks? Generally, this is a safe procedure. However, problems may occur, including:  Abdominal pain or cramping.  Light bleeding (spotting).  Increased vaginal discharge.  Infection. What happens before the procedure?  Your health care provider may have you take an over-the-counter pain medicine.  You may be given medicine to stop any abnormal bleeding.  You may be given antibiotic medicine to help prevent infection.  You may be asked to take a pregnancy test. This is usually in the form of a urine test.  You may have a pelvic exam.  You will be asked to empty your bladder. What happens during the procedure?  You will lie down on the exam table with your feet in stirrups or with your knees bent and your feet flat on the  table.  A slender, handheld device (transducer) will be lubricated and placed into your vagina.  The transducer will be positioned to send sound waves to your uterus. The sound waves are sent to a computer and are turned into images, which your health care provider sees during the procedure.  The transducer will be removed from your vagina.  An instrument will be inserted to widen the opening of your vagina (speculum).  A swab with germ-killing solution (antiseptic) will be used to clean the opening to your uterus (cervix).  A long, thin tube (catheter) will be placed through your cervix into your uterus.  The speculum will be removed.  The transducer will be placed back into your vagina to take more images.  Your uterus will be filled with a germ-free, salt-water solution (sterile saline) through the catheter. You may feel some cramping.  A fluid that contains air bubbles may be sent through the catheter to make it easier to see the fallopian tubes.  The transducer and catheter will be removed. The procedure may vary among health care providers and hospitals. What happens after the procedure?  It is up to you to get the results of your procedure. Ask your health care provider, or the department that is doing the procedure, when your results will be ready. Summary  A sonohysterogram is a procedure that creates images of the inside of the uterus.  The risks of this procedure are very low. Most women experience cramping and spotting after the procedure.  You may need to have   a pelvic exam and take a pregnancy test before this procedure. This procedure will not be done if you are pregnant or have an infection. This information is not intended to replace advice given to you by your health care provider. Make sure you discuss any questions you have with your health care provider. Document Revised: 06/20/2017 Document Reviewed: 06/03/2016 Elsevier Patient Education  2020 Elsevier  Inc.  

## 2020-06-21 NOTE — Progress Notes (Signed)
GYNECOLOGY  VISIT   HPI: 54 y.o.   Married  Caucasian  female   G2P2 with Patient's last menstrual period was 05/15/2020.   here for irregular bleeding. Per patient started bleeding with clots 05/15/20 after not having a period since Nov 27, 2019. Per patient, period stopped around Thanksgiving with taking Provera. Patient c/o of now having a "yellowish" discharge.  Her last Provera dose was yesterday.  She was taking 10 mg bid.   Her menstruation in October started and stopped.  Heavy bleeding 18 and 19 of November with clots.   In the ER on 06/10/20, her Hgb was 12.9.  She had an ultrasound showing thickened vascular endometrium.   She does have a history of anemia and needing iron transfusions.   Her menses lasted 7 - 9 days in the past.   Had Covid, dx on Nov 17. She was vaccinated.   GYNECOLOGIC HISTORY: Patient's last menstrual period was 05/15/2020. Contraception:  Vasectomy Menopausal hormone therapy:  none Last mammogram:  05/28/19 BIRADS 1 negative/density c Last pap smear:   08/20/19 Neg:Neg HR HPV    08/04/15 Neg:Neg HR HPV        OB History    Gravida  2   Para  2   Term      Preterm      AB      Living  2     SAB      TAB      Ectopic      Multiple      Live Births                 Patient Active Problem List   Diagnosis Date Noted  . Menorrhagia 12/21/2013  . 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 ANXIETY 03/16/2007  . DEPRESSION 03/16/2007  . HEADACHE 03/16/2007    Past Medical History:  Diagnosis Date  . Allergy    seasaonal  . Anemia   . Anxiety   . Menorrhagia   . PMS (premenstrual syndrome)     Past Surgical History:  Procedure Laterality Date  . INTRAUTERINE DEVICE (IUD) INSERTION  12/21/13  . WISDOM TOOTH EXTRACTION Bilateral age 25    Current Outpatient Medications  Medication Sig Dispense Refill  . Calcium Carbonate-Vitamin D (CALCIUM + D PO) Take 1 tablet by mouth daily. Reported on 08/04/2015    . escitalopram (LEXAPRO) 10 MG  tablet Take 5 mg by mouth daily.   5  . Multiple Vitamins-Minerals (EMERGEN-C IMMUNE PO) Take by mouth.    . Multiple Vitamins-Minerals (VITAMIN D3 COMPLETE PO) Take 1,000 Units by mouth.    . Multiple Vitamins-Minerals (ZINC PO) Take by mouth.    . Multiple Vitamin (MULTIVITAMIN) capsule Take 1 capsule by mouth daily. Reported on 08/04/2015 (Patient not taking: Reported on 06/21/2020)     No current facility-administered medications for this visit.     ALLERGIES: Patient has no known allergies.  Family History  Problem Relation Age of Onset  . Hyperlipidemia Mother   . Hypertension Mother   . Cancer - Lung Mother 45       stage IV non small cell, chemo starting 5/14  . COPD Father   . Colon polyps Father   . Colon cancer Neg Hx   . Rectal cancer Neg Hx   . Stomach cancer Neg Hx     Social History   Socioeconomic History  . Marital status: Married    Spouse name: Not on file  . Number of  children: 2  . Years of education: Not on file  . Highest education level: Not on file  Occupational History    Employer: 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 Smoking status: Never Smoker  . Smokeless tobacco: Never Used  Vaping Use  . Vaping Use: Never used  Substance and Sexual Activity  . Alcohol use: Yes    Alcohol/week: 3.0 standard drinks    Types: 3 Glasses of wine per week  . Drug use: No  . Sexual activity: Yes    Partners: Male    Comment: Husband vasectomy  Other Topics Concern  . Not on file  Social History Narrative  . Not on file   Social Determinants of Health   Financial Resource Strain:   . Difficulty of Paying Living Expenses: Not on file  Food Insecurity:   . Worried About Programme researcher, broadcasting/film/video in the Last Year: Not on file  . Ran Out of Food in the Last Year: Not on file  Transportation Needs:   . Lack of Transportation (Medical): Not on file  . Lack of Transportation (Non-Medical): Not on file  Physical Activity:   . Days of Exercise per Week:  Not on file  . Minutes of Exercise per Session: Not on file  Stress:   . Feeling of Stress : Not on file  Social Connections:   . Frequency of Communication with Friends and Family: Not on file  . Frequency of Social Gatherings with Friends and Family: Not on file  . Attends Religious Services: Not on file  . Active Member of Clubs or Organizations: Not on file  . Attends Banker Meetings: Not on file  . Marital Status: Not on file  Intimate Partner Violence:   . Fear of Current or Ex-Partner: Not on file  . Emotionally Abused: Not on file  . Physically Abused: Not on file  . Sexually Abused: Not on file    Review of Systems  Constitutional: Negative.   HENT: Negative.   Eyes: Negative.   Respiratory: Negative.   Cardiovascular: Negative.   Gastrointestinal: Negative.   Endocrine: Negative.   Genitourinary: Positive for vaginal discharge.  Musculoskeletal: Negative.   Skin: Negative.   Allergic/Immunologic: Negative.   Neurological: Negative.   Hematological: Negative.   Psychiatric/Behavioral: Negative.     PHYSICAL EXAMINATION:    BP 122/72 (BP Location: Right Arm, Patient Position: Sitting, Cuff Size: Normal)   Pulse 72   Resp 16   Ht 5' 0.75" (1.543 m)   Wt 129 lb (58.5 kg)   LMP 05/15/2020   BMI 24.58 kg/m     General appearance: alert, cooperative and appears stated age  Pelvic: External genitalia:  no lesions              Urethra:  normal appearing urethra with no masses, tenderness or lesions              Bartholins and Skenes: normal                 Vagina: normal appearing vagina with yellowish discharge, no lesions              Cervix: no lesions                Bimanual Exam:  Uterus:  normal size, contour, position, consistency, mobility, non-tender              Adnexa: no mass, fullness, tenderness  Chaperone was present for exam.  ASSESSMENT  Menorrhagia with irregular menses.  Thickened vascular endometrium on pelvic  US.  PLAN  We reviewed potential causes for her bleeding including anovulation, polyp, premalignant or malignant disease.  She agrees to return for a sonohysterogram and potential endometrial biopsy.   Procedures explained.  She understands she may have a period with completion of the course of Provera. Questions invited and answered.  30 min total time was spent for this patient encounter, including preparation, face-to-face counseling with the patient, coordination of care, and documentation of the encounter.

## 2020-06-26 ENCOUNTER — Other Ambulatory Visit: Payer: Self-pay

## 2020-06-26 ENCOUNTER — Telehealth: Payer: Self-pay

## 2020-06-26 DIAGNOSIS — N921 Excessive and frequent menstruation with irregular cycle: Secondary | ICD-10-CM

## 2020-06-26 DIAGNOSIS — R9389 Abnormal findings on diagnostic imaging of other specified body structures: Secondary | ICD-10-CM

## 2020-06-26 NOTE — Telephone Encounter (Signed)
Spoke with patient regarding benefits for recommended Sonohysterogram and Endometrial Biopsy. Patient acknowledges understanding of information presented. Patient is aware of cancellation policy. Patient scheduled for 07/25/2020 at 0130PM with Dr. Edward Jolly. Encounter closed.

## 2020-06-26 NOTE — Progress Notes (Signed)
SHGM and EMB orders placed per Dr Edward Jolly.  Encounter closed

## 2020-06-27 ENCOUNTER — Other Ambulatory Visit: Payer: Self-pay | Admitting: Obstetrics and Gynecology

## 2020-06-27 DIAGNOSIS — Z1231 Encounter for screening mammogram for malignant neoplasm of breast: Secondary | ICD-10-CM

## 2020-07-25 ENCOUNTER — Ambulatory Visit: Payer: BC Managed Care – PPO | Admitting: Obstetrics and Gynecology

## 2020-07-25 ENCOUNTER — Encounter: Payer: Self-pay | Admitting: Obstetrics and Gynecology

## 2020-07-25 ENCOUNTER — Ambulatory Visit (INDEPENDENT_AMBULATORY_CARE_PROVIDER_SITE_OTHER): Payer: BC Managed Care – PPO

## 2020-07-25 ENCOUNTER — Other Ambulatory Visit: Payer: Self-pay

## 2020-07-25 ENCOUNTER — Other Ambulatory Visit (HOSPITAL_COMMUNITY)
Admission: RE | Admit: 2020-07-25 | Discharge: 2020-07-25 | Disposition: A | Discharge status: Home / Self Care | Payer: BC Managed Care – PPO | Source: Ambulatory Visit | Attending: Obstetrics and Gynecology | Admitting: Obstetrics and Gynecology

## 2020-07-25 VITALS — Ht 60.75 in | Wt 129.0 lb

## 2020-07-25 DIAGNOSIS — N921 Excessive and frequent menstruation with irregular cycle: Secondary | ICD-10-CM

## 2020-07-25 DIAGNOSIS — R9389 Abnormal findings on diagnostic imaging of other specified body structures: Secondary | ICD-10-CM

## 2020-07-25 DIAGNOSIS — N83201 Unspecified ovarian cyst, right side: Secondary | ICD-10-CM

## 2020-07-25 DIAGNOSIS — N840 Polyp of corpus uteri: Secondary | ICD-10-CM | POA: Diagnosis not present

## 2020-07-25 NOTE — Progress Notes (Signed)
GYNECOLOGY  VISIT   HPI: 55 y.o.   Married  Caucasian  female   G2P2 with No LMP recorded.   here for sonohysterogram and EMB.   Prior US in the hospital indicated thickening of the endometrium and increased vascularity.   No further bleeding.  Took the course of Provera.   GYNECOLOGIC HISTORY: No LMP recorded. Contraception: vasectomy Menopausal hormone therapy:  none Last mammogram: 05/28/19 BIRADS 1 negative/density c--has appt.  Last pap smear: 08/20/19 Neg:Neg HR HPV                                     08/04/15 Neg:Neg HR HPV        OB History    Gravida  2   Para  2   Term      Preterm      AB      Living  2     SAB      IAB      Ectopic      Multiple      Live Births                 Patient Active Problem List   Diagnosis Date Noted  . Menorrhagia 12/21/2013  . 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 ANXIETY 03/16/2007  . DEPRESSION 03/16/2007  . HEADACHE 03/16/2007    Past Medical History:  Diagnosis Date  . Allergy    seasaonal  . Anemia   . Anxiety   . Menorrhagia   . PMS (premenstrual syndrome)     Past Surgical History:  Procedure Laterality Date  . INTRAUTERINE DEVICE (IUD) INSERTION  12/21/13  . WISDOM TOOTH EXTRACTION Bilateral age 54    Current Outpatient Medications  Medication Sig Dispense Refill  . Calcium Carbonate-Vitamin D (CALCIUM + D PO) Take 1 tablet by mouth daily. Reported on 08/04/2015    . escitalopram (LEXAPRO) 10 MG tablet Take 5 mg by mouth daily.   5  . Multiple Vitamin (MULTIVITAMIN) capsule Take 1 capsule by mouth daily. Reported on 08/04/2015    . Multiple Vitamins-Minerals (EMERGEN-C IMMUNE PO) Take by mouth.    . Multiple Vitamins-Minerals (VITAMIN D3 COMPLETE PO) Take 1,000 Units by mouth.    . Multiple Vitamins-Minerals (ZINC PO) Take by mouth.     No current facility-administered medications for this visit.     ALLERGIES: Patient has no known allergies.  Family History  Problem Relation Age of Onset  .  Hyperlipidemia Mother   . Hypertension Mother   . Cancer - Lung Mother 52       stage IV non small cell, chemo starting 5/14  . COPD Father   . Colon polyps Father   . Colon cancer Neg Hx   . Rectal cancer Neg Hx   . Stomach cancer Neg Hx     Social History   Socioeconomic History  . Marital status: Married    Spouse name: Not on file  . Number of children: 2  . Years of education: Not on file  . Highest education level: Not on file  Occupational History    Employer: 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 Smoking status: Never Smoker  . Smokeless tobacco: Never Used  Vaping Use  . Vaping Use: Never used  Substance and Sexual Activity  . Alcohol use: Yes    Alcohol/week: 3.0 standard drinks    Types: 3  Glasses of wine per week  . Drug use: No  . Sexual activity: Yes    Partners: Male    Comment: Husband vasectomy  Other Topics Concern  . Not on file  Social History Narrative  . Not on file   Social Determinants of Health   Financial Resource Strain: Not on file  Food Insecurity: Not on file  Transportation Needs: Not on file  Physical Activity: Not on file  Stress: Not on file  Social Connections: Not on file  Intimate Partner Violence: Not on file    Review of Systems  All other systems reviewed and are negative.   PHYSICAL EXAMINATION:    Ht 5' 0.75" (1.543 m)   Wt 129 lb (58.5 kg)   BMI 24.58 kg/m     General appearance: alert, cooperative and appears stated age   Pelvic US  Uterus with 4 fibroids noted, largest 1.69 cm.  EMS 1.1 cm.  Right ovary with 4.2 cm simple cyst.  No abnormal blood flow.  Left ovary normal. No adnexal masses.  No free fluid.   Saline ultrasound and EMB Consent for procedures.  Tenaculum to anterior cervical lip. Sterile saline injected in uterine cavity after sterile prep with Hibiclens.  Thickening in the central portion of the uterine cavity - long and thin.  Polyp?  Blood?  Distention of cavity is not a  lot.   Sterile reprep with Hibiclens prior to replacing tenaculum to anterior cervical lip and passing Pipelle to 7 cm x 2.  Tissue to pathology.   No complications.  Minimal EBL.    Chaperone was present for exam.  ASSESSMENT  Perimenopausal abnormal uterine bleeding.  Simple right ovarian cyst.   PLAN  Pelvic US findings reviewed.  FU EMB. Precautions given.  Potential hysteroscopy with resection of endometrial mass, dilation and curettage.

## 2020-07-25 NOTE — Patient Instructions (Addendum)
Endometrial Biopsy, Care After This sheet gives you information about how to care for yourself after your procedure. Your health care provider may also give you more specific instructions. If you have problems or questions, contact your health care provider. What can I expect after the procedure? After the procedure, it is common to have:  Mild cramping.  A small amount of vaginal bleeding for a few days. This is normal. Follow these instructions at home:   Take over-the-counter and prescription medicines only as told by your health care provider.  Do not douche, use tampons, or have sexual intercourse until your health care provider approves.  Return to your normal activities as told by your health care provider. Ask your health care provider what activities are safe for you.  Follow instructions from your health care provider about any activity restrictions, such as restrictions on strenuous exercise or heavy lifting. Contact a health care provider if:  You have heavy bleeding, or bleed for longer than 2 days after the procedure.  You have bad smelling discharge from your vagina.  You have a fever or chills.  You have a burning sensation when urinating or you have difficulty urinating.  You have severe pain in your lower abdomen. Get help right away if:  You have severe cramps in your stomach or back.  You pass large blood clots.  Your bleeding increases.  You become weak or light-headed, or you pass out. Summary  After the procedure, it is common to have mild cramping and a small amount of vaginal bleeding for a few days.  Do not douche, use tampons, or have sexual intercourse until your health care provider approves.  Return to your normal activities as told by your health care provider. Ask your health care provider what activities are safe for you. This information is not intended to replace advice given to you by your health care provider. Make sure you discuss any  questions you have with your health care provider. Document Revised: 06/20/2017 Document Reviewed: 07/24/2016 Elsevier Patient Education  [employer].  Hysteroscopy Hysteroscopy is a procedure that is used to examine the inside of a woman's womb (uterus). This may be done for various reasons, including:  To look for lumps (tumors) and other growths in the uterus.  To evaluate abnormal bleeding, fibroid tumors, polyps, scar tissue (adhesions), or cancer of the uterus.  To determine the cause of an inability to get pregnant (infertility) or repeated losses of pregnancies (miscarriages).  To find a lost IUD (intrauterine device).  To perform a procedure that permanently prevents pregnancy (sterilization). During this procedure, a thin, flexible tube with a small light and camera (hysteroscope) is used to examine the uterus. The camera sends images to a monitor in the room so that your health care provider can view the inside of your uterus. A hysteroscopy should be done right after a menstrual period to make sure that you are not pregnant. Tell a health care provider about:  Any allergies you have.  All medicines you are taking, including vitamins, herbs, eye drops, creams, and over-the-counter medicines.  Any problems you or family members have had with the use of anesthetic medicines.  Any blood disorders you have.  Any surgeries you have had.  Any medical conditions you have.  Whether you are pregnant or may be pregnant. What are the risks? Generally, this is a safe procedure. However, problems may occur, including:  Excessive bleeding.  Infection.  Damage to the uterus or other structures  or organs.  Allergic reaction to medicines or fluids that are used in the procedure. What happens before the procedure? Staying hydrated Follow instructions from your health care provider about hydration, which may include:  Up to 2 hours before the procedure - you may continue  to drink clear liquids, such as water, clear fruit juice, black coffee, and plain tea. Eating and drinking restrictions Follow instructions from your health care provider about eating and drinking, which may include:  8 hours before the procedure - stop eating solid foods and drink clear liquids only  2 hours before the procedure - stop drinking clear liquids. General instructions  Ask your health care provider about: ? Changing or stopping your normal medicines. This is important if you take diabetes medicines or blood thinners. ? Taking medicines such as aspirin and ibuprofen. These medicines can thin your blood and cause bleeding. Do not take these medicines for 1 week before your procedure, or as told by your health care provider.  Do not use any products that contain nicotine or tobacco for 2 weeks before the procedure. This includes cigarettes and e-cigarettes. If you need help quitting, ask your health care provider.  Medicine may be placed in your cervix the day before the procedure. This medicine causes the cervix to have a larger opening (dilate). The larger opening makes it easier for the hysteroscope to be inserted into the uterus during the procedure.  Plan to have someone with you for the first 24-48 hours after the procedure, especially if you are given a medicine to make you fall asleep (general anesthetic).  Plan to have someone take you home from the hospital or clinic. What happens during the procedure?  To lower your risk of infection: ? Your health care team will wash or sanitize their hands. ? Your skin will be washed with soap. ? Hair may be removed from the surgical area.  An IV tube will be inserted into one of your veins.  You may be given one or more of the following: ? A medicine to help you relax (sedative). ? A medicine that numbs the area around the cervix (local anesthetic). ? A medicine to make you fall asleep (general anesthetic).  A hysteroscope  will be inserted through your vagina and into your uterus.  Air or fluid will be used to enlarge your uterus, enabling your health care provider to see your uterus better. The amount of fluid used will be carefully checked throughout the procedure.  In some cases, tissue may be gently scraped from inside the uterus and sent to a lab for testing (biopsy). The procedure may vary among health care providers and hospitals. What happens after the procedure?  Your blood pressure, heart rate, breathing rate, and blood oxygen level will be monitored until the medicines you were given have worn off.  You may have some cramping. You may be given medicines for this.  You may have bleeding, which varies from light spotting to menstrual-like bleeding. This is normal.  If you had a biopsy done, it is your responsibility to get the results of your procedure. Ask your health care provider, or the department performing the procedure, when your results will be ready. Summary  Hysteroscopy is a procedure that is used to examine the inside of a woman's womb (uterus).  After the procedure, you may have bleeding, which varies from light spotting to menstrual-like bleeding. This is normal. You may also have cramping.  Plan to have someone take you home  from the hospital or clinic. This information is not intended to replace advice given to you by your health care provider. Make sure you discuss any questions you have with your health care provider. Document Revised: 06/20/2017 Document Reviewed: 08/06/2016 Elsevier Patient Education  [place].  Dilation and Curettage or Vacuum Curettage  Dilation and curettage (D&C) and vacuum curettage are minor procedures. A D&C involves stretching (dilation) the cervix and scraping (curettage) the inside lining of the uterus (endometrium). During a D&C, tissue is gently scraped from the endometrium, starting from the top portion of the uterus down to the lowest  part of the uterus (cervix). During a vacuum curettage, the lining and tissue in the uterus are removed with the use of gentle suction. Curettage may be performed to either diagnose or treat a problem. As a diagnostic procedure, curettage is performed to examine tissues from the uterus. A diagnostic curettage may be done if you have:  Irregular bleeding in the uterus.  Bleeding with the development of clots.  Spotting between menstrual periods.  Prolonged menstrual periods or other abnormal bleeding.  Bleeding after menopause.  No menstrual period (amenorrhea).  A change in size and shape of the uterus.  Abnormal endometrial cells discovered during a Pap test. As a treatment procedure, curettage may be performed for the following reasons:  Removal of an IUD (intrauterine device).  Removal of retained placenta after giving birth.  Abortion.  Miscarriage.  Removal of endometrial polyps.  Removal of uncommon types of noncancerous lumps (fibroids). Tell a health care provider about:  Any allergies you have, including allergies to prescribed medicine or latex.  All medicines you are taking, including vitamins, herbs, eye drops, creams, and over-the-counter medicines. This is especially important if you take any blood-thinning medicine. Bring a list of all of your medicines to your appointment.  Any problems you or family members have had with anesthetic medicines.  Any blood disorders you have.  Any surgeries you have had.  Your medical history and any medical conditions you have.  Whether you are pregnant or may be pregnant.  Recent vaginal infections you have had.  Recent menstrual periods, bleeding problems you have had, and what form of birth control (contraception) you use. What are the risks? Generally, this is a safe procedure. However, problems may occur, including:  Infection.  Heavy vaginal bleeding.  Allergic reactions to medicines.  Damage to the  cervix or other structures or organs.  Development of scar tissue (adhesions) inside the uterus, which can cause abnormal amounts of menstrual bleeding. This may make it harder to get pregnant in the future.  A hole (perforation) or puncture in the uterine wall. This is rare. What happens before the procedure? Staying hydrated Follow instructions from your health care provider about hydration, which may include:  Up to 2 hours before the procedure - you may continue to drink clear liquids, such as water, clear fruit juice, black coffee, and plain tea. Eating and drinking restrictions Follow instructions from your health care provider about eating and drinking, which may include:  8 hours before the procedure - stop eating heavy meals or foods such as meat, fried foods, or fatty foods.  6 hours before the procedure - stop eating light meals or foods, such as toast or cereal.  6 hours before the procedure - stop drinking milk or drinks that contain milk.  2 hours before the procedure - stop drinking clear liquids. If your health care provider told you to take your  medicine(s) on the day of your procedure, take them with only a sip of water. Medicines  Ask your health care provider about: ? Changing or stopping your regular medicines. This is especially important if you are taking diabetes medicines or blood thinners. ? Taking medicines such as aspirin and ibuprofen. These medicines can thin your blood. Do not take these medicines before your procedure if your health care provider instructs you not to.  You may be given antibiotic medicine to help prevent infection. General instructions  For 24 hours before your procedure, do not: ? Douche. ? Use tampons. ? Use medicines, creams, or suppositories in the vagina. ? Have sexual intercourse.  You may be given a pregnancy test on the day of the procedure.  Plan to have someone take you home from the hospital or clinic.  You may have a  blood or urine sample taken.  If you will be going home right after the procedure, plan to have someone with you for 24 hours. What happens during the procedure?  To reduce your risk of infection: ? Your health care team will wash or sanitize their hands. ? Your skin will be washed with soap.  An IV tube will be inserted into one of your veins.  You will be given one of the following: ? A medicine that numbs the area in and around the cervix (local anesthetic). ? A medicine to make you fall asleep (general anesthetic).  You will lie down on your back, with your feet in foot rests (stirrups).  The size and position of your uterus will be checked.  A lubricated instrument (speculum or Sims retractor) will be inserted into the back side of your vagina. The speculum will be used to hold apart the walls of your vagina so your health care provider can see your cervix.  A tool (tenaculum) will be attached to the lip of the cervix to stabilize it.  Your cervix will be softened and dilated. This may be done by: ? Taking a medicine. ? Having tapered dilators or thin rods (laminaria) or gradual widening instruments (tapered dilators) inserted into your cervix.  A small, sharp, curved instrument (curette) will be used to scrape a small amount of tissue or cells from the endometrium or cervical canal. In some cases, gentle suction is applied with the curette. The curette will then be removed. The cells will be taken to a lab for testing. The procedure may vary among health care providers and hospitals. What happens after the procedure?  You may have mild cramping, backache, pain, and light bleeding or spotting. You may pass small blood clots from your vagina.  You may have to wear compression stockings. These stockings help to prevent blood clots and reduce swelling in your legs.  Your blood pressure, heart rate, breathing rate, and blood oxygen level will be monitored until the medicines you  were given have worn off. Summary  Dilation and curettage (D&C) involves stretching (dilation) the cervix and scraping (curettage) the inside lining of the uterus (endometrium).  After the procedure, you may have mild cramping, backache, pain, and light bleeding or spotting. You may pass small blood clots from your vagina.  Plan to have someone take you home from the hospital or clinic. This information is not intended to replace advice given to you by your health care provider. Make sure you discuss any questions you have with your health care provider. Document Revised: 06/20/2017 Document Reviewed: 03/24/2016 Elsevier Patient Education  2020 [employer].

## 2020-07-27 LAB — SURGICAL PATHOLOGY

## 2020-07-31 ENCOUNTER — Telehealth: Payer: Self-pay | Admitting: Obstetrics and Gynecology

## 2020-07-31 NOTE — Telephone Encounter (Signed)
Please contact patient in follow up to my My Chart message with her endometrial biopsy results.  She has a benign endometrial polyp.   I am recommending a hysteroscopic polypectomy with Myosure and and dilation and curettage.   This is what we were expecting.   Procedure will need precert and scheduling.

## 2020-08-02 NOTE — Telephone Encounter (Signed)
Call to patient. Per DPR, OK to leave message on voicemail. °  °Left voicemail requesting a return call to Desiree Garcia to review benefits and schedule recommended surgery with Brook A. Silva, MD, FACOG. °

## 2020-08-08 ENCOUNTER — Telehealth: Payer: Self-pay

## 2020-08-08 NOTE — Telephone Encounter (Signed)
I agree with your advice for taking the day of surgery and the following day off from work.  Resection is recommended for entire removal and evaluation of the pathology. Thank you!

## 2020-08-08 NOTE — Telephone Encounter (Signed)
I spoke with patient about D&C, Hysteroscopy that she is planning. She works in a [employer] and is on her feet and asked when she could return to work. I told her she would need to take the day of surgery off and probably the next day.  She asked if she felt up to it was there any reason she could not go to work the next day. I told her that would be fine if she felt like it but to plan for a day off the day after surgery.  She was asking if any other way to treat the polyp other than resection. Advised her important to remove it to make sure all benign and to have D&C for endometrial thickening. She agreed.  Dr. Edward Jolly, just wanted to make you aware of my conversation/advice with this patient.

## 2020-08-10 NOTE — Telephone Encounter (Signed)
Call to patient. Per DPR, OK to leave message on voicemail. °  °Left voicemail requesting a return call to Desiree Garcia to review benefits and schedule recommended surgery with Brook A. Silva, MD, FACOG. °

## 2020-08-11 ENCOUNTER — Ambulatory Visit
Admission: RE | Admit: 2020-08-11 | Discharge: 2020-08-11 | Disposition: A | Discharge status: Home / Self Care | Payer: BC Managed Care – PPO | Source: Ambulatory Visit | Attending: Obstetrics and Gynecology | Admitting: Obstetrics and Gynecology

## 2020-08-11 ENCOUNTER — Other Ambulatory Visit: Payer: Self-pay

## 2020-08-11 DIAGNOSIS — Z1231 Encounter for screening mammogram for malignant neoplasm of breast: Secondary | ICD-10-CM

## 2020-08-14 NOTE — Telephone Encounter (Signed)
Spoke with patient regarding surgery benefits. Patient acknowledges understanding of information presented. Patient is aware that benefits presented are for professional benefits only. Patient is aware that once surgery is scheduled, the hospital will call with separate benefits. Patient is aware of surgery cancellation policy.  Patient stated that she is going to speak with her employer regarding dates and return call to proceed with scheduling.  Routing to Carmelina Dane, Charity fundraiser, for [employer].

## 2020-08-17 NOTE — Telephone Encounter (Signed)
Patient left multiple voicemails requesting a return call regarding proceeding with scheduling surgery.  Return call to patient. Per DPR, OK to leave message on voicemail.   Left voicemail requesting a return call to [facility].

## 2020-08-21 ENCOUNTER — Telehealth: Payer: Self-pay

## 2020-08-21 NOTE — Telephone Encounter (Signed)
Call from patient regarding increased bleeding. Patient stated that she has been bleeding for 13 days, started on 08/09/2020. Patient stated that it has progressively gotten heavier and is "clotty." Patient has a history of anemia. Patient is currently changing pad and tampon every 2-3 hours and getting up in the middle of the night to change a pad and tampon. Patient is requesting medication to stop the bleeding, if possible. Patient is a pending surgery patient.

## 2020-08-21 NOTE — Telephone Encounter (Signed)
Spoke with patient. Reviewed surgery dates. Patient request to schedule on a Tuesday, request to proceed with surgery on 09/26/20. Patient declines earlier dates. Advised patient I will proceed with scheduling and f/u once surgery is posted.   Patient reports vaginal bleeding for 13 days. She is wearing a regular pad with super plus tampon. She changes the tampon twice in the morning, twice in the afternoon and wakes up at approximately 4am to a saturated pad and tampon. Reports small clots and fatigue. Denies any other symptoms. Patient is requesting provera Rx. Strong encouraged patient to consider earlier surgery date, patient declined due to her work schedule.   Advised OV recommended for further evaluation, patient agreeable. OV scheduled on 2/2 at 4:30pm with Dr. Edward Jolly. Patient declines earlier appt due to work schedule. Patient asking if provera Rx can be sent? Advised patient I will forward to Dr. Edward Jolly to review, our office will f/u with any additional recommendations. Patient agreeable.   Dr. Edward Jolly -please review and advise.

## 2020-08-21 NOTE — Telephone Encounter (Signed)
See telephone encounter dated 07/31/20.   Encounter closed.

## 2020-08-21 NOTE — Telephone Encounter (Signed)
Spoke with patient regarding surgery scheduling. Patient would like to proceed with scheduling for 09/12/2020, if possible. Informed patient that she would be getting a call from Carmelina Dane, RN, regarding surgery details. Patient is requesting a call back on Wednesday, 08/23/2020 anytime after 1pm.

## 2020-08-21 NOTE — Telephone Encounter (Signed)
I recommend Aygestin 5 mg po bid.  This is another from of progesterone.  Disp: 60, RF:  none.   Please keep appointment this week with me.

## 2020-08-22 MED ORDER — NORETHINDRONE ACETATE 5 MG PO TABS: 5.0000 mg | ORAL_TABLET | Freq: Two times a day (BID) | ORAL | 0 refills | Status: DC

## 2020-08-22 NOTE — Telephone Encounter (Signed)
Call placed to patient, left detailed message, ok per dpr. Advised as seen below per Dr. Edward Jolly.  Return call to office if any additional questions. Rx has been sent to [facility] in [place].

## 2020-08-23 ENCOUNTER — Encounter: Payer: Self-pay | Admitting: Obstetrics and Gynecology

## 2020-08-23 ENCOUNTER — Ambulatory Visit: Payer: BC Managed Care – PPO | Admitting: Obstetrics and Gynecology

## 2020-08-23 ENCOUNTER — Other Ambulatory Visit: Payer: Self-pay

## 2020-08-23 VITALS — BP 140/72 | HR 60 | Ht 60.75 in | Wt 129.0 lb

## 2020-08-23 DIAGNOSIS — M5432 Sciatica, left side: Secondary | ICD-10-CM | POA: Diagnosis not present

## 2020-08-23 DIAGNOSIS — N83202 Unspecified ovarian cyst, left side: Secondary | ICD-10-CM

## 2020-08-23 DIAGNOSIS — M9903 Segmental and somatic dysfunction of lumbar region: Secondary | ICD-10-CM | POA: Diagnosis not present

## 2020-08-23 DIAGNOSIS — N921 Excessive and frequent menstruation with irregular cycle: Secondary | ICD-10-CM | POA: Diagnosis not present

## 2020-08-23 MED ORDER — NORETHINDRONE ACETATE 5 MG PO TABS
5.0000 mg | ORAL_TABLET | Freq: Two times a day (BID) | ORAL | 0 refills | Status: DC
Start: 2020-08-23 — End: 2020-09-15

## 2020-08-23 NOTE — Progress Notes (Signed)
GYNECOLOGY  VISIT   HPI: 55 y.o.   Married  Caucasian  female   G2P2 with Patient's last menstrual period was 08/10/2020 (approximate).   here for heavy bleeding which has improved since calling office.    Bled for 14 days, but it has almost stopped.  Blood was dark and thick.  She has not taken the Aygestin that was sent to her pharmacy to control the bleeding.    Hysteroscopy scheduled for 09/26/20 for vascular thickened endometrium and benign polyp on endometrial biopsy.   Prior ultrasound showed a 4 cm simple left ovarian cyst.   Patient had a similar episode of heavy bleeding in November, and she was seen in the ER.   GYNECOLOGIC HISTORY: Patient's last menstrual period was 08/10/2020 (approximate). Contraception:  vasectomy Menopausal hormone therapy:  none Last mammogram: 08-11-20 Neg/BiRads1 Last pap smear:  08/20/19 Neg:Neg HR HPV, 08/04/15 Neg:Neg HR HPV                OB History    Gravida  2   Para  2   Term      Preterm      AB      Living  2     SAB      IAB      Ectopic      Multiple      Live Births                 Patient Active Problem List   Diagnosis Date Noted  . Menorrhagia 12/21/2013  . 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 ANXIETY 03/16/2007  . DEPRESSION 03/16/2007  . HEADACHE 03/16/2007    Past Medical History:  Diagnosis Date  . Allergy    seasaonal  . Anemia   . Anxiety   . Menorrhagia   . PMS (premenstrual syndrome)     Past Surgical History:  Procedure Laterality Date  . INTRAUTERINE DEVICE (IUD) INSERTION  12/21/13  . WISDOM TOOTH EXTRACTION Bilateral age 66    Current Outpatient Medications  Medication Sig Dispense Refill  . Calcium Carbonate-Vitamin D (CALCIUM + D PO) Take 1 tablet by mouth daily. Reported on 08/04/2015    . escitalopram (LEXAPRO) 10 MG tablet Take 5 mg by mouth daily.   5  . Multiple Vitamin (MULTIVITAMIN) capsule Take 1 capsule by mouth daily. Reported on 08/04/2015    . Multiple Vitamins-Minerals  (EMERGEN-C IMMUNE PO) Take by mouth.    . Multiple Vitamins-Minerals (VITAMIN D3 COMPLETE PO) Take 1,000 Units by mouth.    . Multiple Vitamins-Minerals (ZINC PO) Take by mouth.    . norethindrone (AYGESTIN) 5 MG tablet Take 1 tablet (5 mg total) by mouth in the morning and at bedtime. (Patient not taking: Reported on 08/23/2020) 60 tablet 0   No current facility-administered medications for this visit.     ALLERGIES: Patient has no known allergies.  Family History  Problem Relation Age of Onset  . Hyperlipidemia Mother   . Hypertension Mother   . Cancer - Lung Mother 70       stage IV non small cell, chemo starting 5/14  . COPD Father   . Colon polyps Father   . Colon cancer Neg Hx   . Rectal cancer Neg Hx   . Stomach cancer Neg Hx     Social History   Socioeconomic History  . Marital status: Married    Spouse name: Not on file  . Number of children: 2  . Years of education:  Not on file  . Highest education level: Not on file  Occupational History    Employer: 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 Smoking status: Never Smoker  . Smokeless tobacco: Never Used  Vaping Use  . Vaping Use: Never used  Substance and Sexual Activity  . Alcohol use: Yes    Alcohol/week: 3.0 standard drinks    Types: 3 Glasses of wine per week  . Drug use: No  . Sexual activity: Yes    Partners: Male    Comment: Husband vasectomy  Other Topics Concern  . Not on file  Social History Narrative  . Not on file   Social Determinants of Health   Financial Resource Strain: Not on file  Food Insecurity: Not on file  Transportation Needs: Not on file  Physical Activity: Not on file  Stress: Not on file  Social Connections: Not on file  Intimate Partner Violence: Not on file    Review of Systems  All other systems reviewed and are negative.   PHYSICAL EXAMINATION:    BP 140/72   Pulse 60   Ht 5' 0.75" (1.543 m)   Wt 129 lb (58.5 kg)   LMP 08/10/2020 (Approximate)   SpO2  100%   BMI 24.58 kg/m     General appearance: alert, cooperative and appears stated age   Pelvic: External genitalia:  no lesions              Urethra:  normal appearing urethra with no masses, tenderness or lesions              Bartholins and Skenes: normal                 Vagina: normal appearing vagina with normal color and discharge, no lesions              Cervix: no lesions.  No blood noted.                 Bimanual Exam:  Uterus:  normal size, contour, position, consistency, mobility, non-tender              Adnexa:  Fullness to the right and anterior to the uterus, nontender.             Chaperone was present for exam.  ASSESSMENT  Endometrial polyp.  Menorrhagia with irregular bleeding.  Abnormal perimenopausal bleeding.  Left ovarian cyst.   PLAN  Start Aygestin 5 mg po bid until after surgery is complete. Repeat pelvic US and preop visit the same day.  Proceed with hysteroscopy with polypectomy, dilation and curettage.  20 min  total time was spent for this patient encounter, including preparation, face-to-face counseling with the patient, coordination of care, and documentation of the encounter.

## 2020-08-23 NOTE — Telephone Encounter (Signed)
Please schedule patient for a preop visit and pelvic ultrasound with me on the same day.    I would like to do this around 09/07/20 if possible.   Dx is left ovarian cyst.

## 2020-08-24 DIAGNOSIS — M5432 Sciatica, left side: Secondary | ICD-10-CM | POA: Diagnosis not present

## 2020-08-24 DIAGNOSIS — M9903 Segmental and somatic dysfunction of lumbar region: Secondary | ICD-10-CM | POA: Diagnosis not present

## 2020-08-28 DIAGNOSIS — M5432 Sciatica, left side: Secondary | ICD-10-CM | POA: Diagnosis not present

## 2020-08-28 DIAGNOSIS — M9903 Segmental and somatic dysfunction of lumbar region: Secondary | ICD-10-CM | POA: Diagnosis not present

## 2020-08-30 DIAGNOSIS — M5432 Sciatica, left side: Secondary | ICD-10-CM | POA: Diagnosis not present

## 2020-08-30 DIAGNOSIS — M9903 Segmental and somatic dysfunction of lumbar region: Secondary | ICD-10-CM | POA: Diagnosis not present

## 2020-08-30 NOTE — Telephone Encounter (Signed)
Message to front office staff to call and schedule.

## 2020-08-30 NOTE — Telephone Encounter (Signed)
Front office scheduled patient for pre-op and PUS on 09/12/20.

## 2020-08-31 DIAGNOSIS — M5432 Sciatica, left side: Secondary | ICD-10-CM | POA: Diagnosis not present

## 2020-08-31 DIAGNOSIS — M9903 Segmental and somatic dysfunction of lumbar region: Secondary | ICD-10-CM | POA: Diagnosis not present

## 2020-09-01 NOTE — Telephone Encounter (Signed)
Left message to call Dillian Feig, RN at [facility], [phone].  

## 2020-09-04 DIAGNOSIS — M5432 Sciatica, left side: Secondary | ICD-10-CM | POA: Diagnosis not present

## 2020-09-04 DIAGNOSIS — M9903 Segmental and somatic dysfunction of lumbar region: Secondary | ICD-10-CM | POA: Diagnosis not present

## 2020-09-05 NOTE — Telephone Encounter (Signed)
Spoke with patient. Surgery date request confirmed.  Advised surgery is scheduled for 09/26/20. 0730, [facility].  Surgery instruction sheet and hospital brochure reviewed, printed copy will be picked up in office at pre-op visit on 09/12/20.   Patient advised of Covid screening and quarantine requirements and agreeable.   Routing to provider. Encounter closed.

## 2020-09-07 DIAGNOSIS — M9903 Segmental and somatic dysfunction of lumbar region: Secondary | ICD-10-CM | POA: Diagnosis not present

## 2020-09-07 DIAGNOSIS — M5432 Sciatica, left side: Secondary | ICD-10-CM | POA: Diagnosis not present

## 2020-09-11 DIAGNOSIS — M5432 Sciatica, left side: Secondary | ICD-10-CM | POA: Diagnosis not present

## 2020-09-11 DIAGNOSIS — M9903 Segmental and somatic dysfunction of lumbar region: Secondary | ICD-10-CM | POA: Diagnosis not present

## 2020-09-12 ENCOUNTER — Ambulatory Visit (INDEPENDENT_AMBULATORY_CARE_PROVIDER_SITE_OTHER): Payer: BC Managed Care – PPO

## 2020-09-12 ENCOUNTER — Other Ambulatory Visit: Payer: Self-pay

## 2020-09-12 ENCOUNTER — Other Ambulatory Visit: Payer: BC Managed Care – PPO | Admitting: Obstetrics and Gynecology

## 2020-09-12 DIAGNOSIS — N83202 Unspecified ovarian cyst, left side: Secondary | ICD-10-CM

## 2020-09-14 DIAGNOSIS — M5432 Sciatica, left side: Secondary | ICD-10-CM | POA: Diagnosis not present

## 2020-09-14 DIAGNOSIS — M9903 Segmental and somatic dysfunction of lumbar region: Secondary | ICD-10-CM | POA: Diagnosis not present

## 2020-09-15 ENCOUNTER — Other Ambulatory Visit: Payer: Self-pay | Admitting: Obstetrics and Gynecology

## 2020-09-15 NOTE — Telephone Encounter (Signed)
Per instructions from Dr Edward Jolly, office visit on 08-23-20, patient to continue Aygestin BID until surgery. Refill sent.

## 2020-09-18 DIAGNOSIS — M9903 Segmental and somatic dysfunction of lumbar region: Secondary | ICD-10-CM | POA: Diagnosis not present

## 2020-09-18 DIAGNOSIS — M5432 Sciatica, left side: Secondary | ICD-10-CM | POA: Diagnosis not present

## 2020-09-20 ENCOUNTER — Encounter (HOSPITAL_BASED_OUTPATIENT_CLINIC_OR_DEPARTMENT_OTHER): Payer: Self-pay | Admitting: Obstetrics and Gynecology

## 2020-09-20 ENCOUNTER — Other Ambulatory Visit: Payer: Self-pay

## 2020-09-20 NOTE — Progress Notes (Signed)
Spoke w/ via phone for pre-op interview--- PT Lab needs dos---- no (per anes)/  Pre-op orders pending              Lab results------ no COVID test ------ 09-22-2020 @ 1440 Arrive at ------- 0530 on 09-26-2020 NPO after MN NO Solid Food.  Clear liquids from MN until--- 0430 Medications to take morning of surgery ----- Lexapro, Norethinedone  Diabetic medication ----- n/a Patient Special Instructions ----- n/a Pre-Op special Istructions ----- n/a Patient verbalized understanding of instructions that were given at this phone interview. Patient denies shortness of breath, chest pain, fever, cough at this phone interview.

## 2020-09-21 DIAGNOSIS — M9903 Segmental and somatic dysfunction of lumbar region: Secondary | ICD-10-CM | POA: Diagnosis not present

## 2020-09-21 DIAGNOSIS — M5432 Sciatica, left side: Secondary | ICD-10-CM | POA: Diagnosis not present

## 2020-09-22 ENCOUNTER — Other Ambulatory Visit (HOSPITAL_COMMUNITY)
Admission: RE | Admit: 2020-09-22 | Discharge: 2020-09-22 | Disposition: A | Discharge status: Home / Self Care | Payer: BC Managed Care – PPO | Source: Ambulatory Visit | Attending: Obstetrics and Gynecology | Admitting: Obstetrics and Gynecology

## 2020-09-22 DIAGNOSIS — Z20822 Contact with and (suspected) exposure to covid-19: Secondary | ICD-10-CM | POA: Insufficient documentation

## 2020-09-22 DIAGNOSIS — Z01812 Encounter for preprocedural laboratory examination: Secondary | ICD-10-CM | POA: Insufficient documentation

## 2020-09-22 LAB — SARS CORONAVIRUS 2 (TAT 6-24 HRS): SARS Coronavirus 2: NEGATIVE

## 2020-09-25 ENCOUNTER — Ambulatory Visit: Payer: BC Managed Care – PPO | Admitting: Obstetrics and Gynecology

## 2020-09-25 ENCOUNTER — Encounter: Payer: Self-pay | Admitting: Obstetrics and Gynecology

## 2020-09-25 ENCOUNTER — Other Ambulatory Visit: Payer: Self-pay

## 2020-09-25 VITALS — BP 132/70 | HR 79 | Wt 131.0 lb

## 2020-09-25 DIAGNOSIS — N840 Polyp of corpus uteri: Secondary | ICD-10-CM

## 2020-09-25 DIAGNOSIS — M5432 Sciatica, left side: Secondary | ICD-10-CM | POA: Diagnosis not present

## 2020-09-25 DIAGNOSIS — M9903 Segmental and somatic dysfunction of lumbar region: Secondary | ICD-10-CM | POA: Diagnosis not present

## 2020-09-25 DIAGNOSIS — N921 Excessive and frequent menstruation with irregular cycle: Secondary | ICD-10-CM

## 2020-09-25 MED ORDER — NORETHINDRONE ACETATE 5 MG PO TABS
5.0000 mg | ORAL_TABLET | Freq: Every evening | ORAL | 0 refills | Status: DC
Start: 2020-09-25 — End: 2021-09-12

## 2020-09-25 NOTE — H&P (Signed)
Office Visit  09/25/2020 [facility], [place]  Obstetrics and Gynecology  Endometrial polyp +1 more  Dx  Office Visit ; Referred by Cleatis Polka., MD  Reason for Visit    Additional Documentation  Vitals:  BP 132/70  Pulse 79  Wt 59.4 kg  LMP 08/09/2020   SpO2 100%  BMI 24.75 kg/m  BSA 1.6 m  Flowsheets:  NEWS,  MEWS Score,  Anthropometrics,  Method of Visit    Encounter Info:  Billing Info,  History,  Allergies,  Detailed Report     All Notes    Addendum Note by Patton Salles, MD at 09/25/2020 11:30 AM  Author: Patton Salles, MD Author Type: Physician Filed: 09/25/2020  1:12 PM  Note Status: Signed Cosign: Cosign Not Required Encounter Date: 09/25/2020  Editor: Patton Salles, MD (Physician)               Addended by: Ardell Isaacs, Debbe Bales E on: 09/25/2020 01:12 PM     Modules accepted: Orders         Progress Notes by Patton Salles, MD at 09/25/2020 11:30 AM  Author: Patton Salles, MD Author Type: Physician Filed: 09/25/2020  1:11 PM  Note Status: Addendum Cosign: Cosign Not Required Encounter Date: 09/25/2020  Editor: Patton Salles, MD (Physician)      Prior Versions: 1. Patton Salles, MD (Physician) at 09/25/2020  1:05 PM - Signed   2. Lum Babe CMA (Certified Engineer, site) at 09/25/2020 11:35 AM - Sign when Signing Visit    GYNECOLOGY  VISIT   HPI: 55 y.o.   Married  Caucasian  female   G2P2 with Patient's last menstrual period was 08/09/2020.   here for pre op      Is on Aygestin bid to control heavy and irregular menses.  She has had no further bleeding.    Pelvic ultrasound ultrasound on 06/10/20 showed a thickened endometrium with a feeder vessel. Sonohysterogram on 07/26/19 showed 4 fibroids with the largest being 1.69 cm, a 4.2 cm left ovarian cyst, and a thickening in the central portion  of the uterine cavity. Endometrial biopsy on 07/26/19 showed a benign endometrial polyp.   Her last pelvic US showed on 09/12/20 showed her left ovarian cyst has resolved.  EMS was 15.45 mm.  The fibroids are still present.    Having left hip sciatica and has been seeing a [facility].    GYNECOLOGIC HISTORY: Patient's last menstrual period was 08/09/2020. Contraception:  Vasectomy Menopausal hormone therapy:  none Last mammogram:  08/11/20 BIRADS 1 negative Last pap smear:   08/20/19 Neg:Neg HR HPV                               08/04/15 Neg:Neg HR HPV                OB History     Gravida  2   Para  2   Term      Preterm      AB      Living  2      SAB      IAB      Ectopic      Multiple      Live Births  Patient Active Problem List    Diagnosis Date Noted  . Menorrhagia 12/21/2013  . 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 ANXIETY 03/16/2007  . DEPRESSION 03/16/2007  . HEADACHE 03/16/2007          Past Medical History:  Diagnosis Date  . Anxiety    . Endometrial polyp    . History of 2019 novel coronavirus disease (COVID-19) 05/2020    per pt had mild symptoms that resolved  . IDA (iron deficiency anemia)      09-20-2020  per pt Hg as been normal in past few yrs, but has been on oral medication and iv rron infusions  . Menorrhagia with irregular cycle    . PMS (premenstrual syndrome)    . Seasonal allergies    . Wears glasses             Past Surgical History:  Procedure Laterality Date  . COLONOSCOPY WITH PROPOFOL   approx. 2018  . WISDOM TOOTH EXTRACTION Bilateral age 45            Current Outpatient Medications  Medication Sig Dispense Refill  . Calcium Carbonate-Vitamin D (CALCIUM + D PO) Take 1 tablet by mouth daily.      Marland Kitchen escitalopram (LEXAPRO) 10 MG tablet Take 5 mg by mouth daily.    5  . ibuprofen (ADVIL) 200 MG tablet Take 200 mg by mouth every 6 (six) hours as needed.      . Multiple Vitamin (MULTIVITAMIN) capsule Take  1 capsule by mouth daily.      . Multiple Vitamins-Minerals (VITAMIN D3 COMPLETE PO) Take 1,000 Units by mouth daily.      . norethindrone (AYGESTIN) 5 MG tablet TAKE 1 TABLET (5 MG TOTAL) BY MOUTH IN THE MORNING AND AT BEDTIME. 60 tablet 0    No current facility-administered medications for this visit.      ALLERGIES: Patient has no known allergies.        Family History  Problem Relation Age of Onset  . Hyperlipidemia Mother    . Hypertension Mother    . Cancer - Lung Mother 32        stage IV non small cell, chemo starting 5/14  . COPD Father    . Colon polyps Father    . Colon cancer Neg Hx    . Rectal cancer Neg Hx    . Stomach cancer Neg Hx        Social History         Socioeconomic History  . Marital status: Married      Spouse name: Not on file  . Number of children: 2  . Years of education: Not on file  . Highest education level: Not on file  Occupational History      Employer: 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 Smoking status: Never Smoker  . Smokeless tobacco: Never Used  Vaping Use  . Vaping Use: Never used  Substance and Sexual Activity  . Alcohol use: Yes      Alcohol/week: 3.0 standard drinks      Types: 3 Glasses of wine per week  . Drug use: No  . Sexual activity: Yes      Partners: Male      Birth control/protection: Other-see comments      Comment: Husband vasectomy  Other Topics Concern  . Not on file  Social History Narrative  . Not on file    Social Determinants of Health    Financial  Resource Strain: Not on file  Food Insecurity: Not on file  Transportation Needs: Not on file  Physical Activity: Not on file  Stress: Not on file  Social Connections: Not on file  Intimate Partner Violence: Not on file      Review of Systems  Constitutional: Negative.   HENT: Negative.   Eyes: Negative.   Respiratory: Negative.   Cardiovascular: Negative.   Gastrointestinal: Negative.   Endocrine: Negative.   Genitourinary:  Negative.   Musculoskeletal: Negative.   Skin: Negative.   Allergic/Immunologic: Negative.   Neurological: Negative.   Hematological: Negative.   Psychiatric/Behavioral: Negative.       PHYSICAL EXAMINATION:     BP 132/70   Pulse 79   Wt 131 lb (59.4 kg)   LMP 08/09/2020 Comment: per pt on medication to have menses  SpO2 100%   BMI 24.75 kg/m     General appearance: alert, cooperative and appears stated age Head: Normocephalic, without obvious abnormality, atraumatic Lungs: clear to auscultation bilaterally Heart: regular rate and rhythm Abdomen: soft, non-tender, no masses,  no organomegaly Extremities: extremities normal, atraumatic, no cyanosis or edema Skin: Skin color, texture, turgor normal. No rashes or lesions No abnormal inguinal nodes palpated Neurologic: Grossly normal   Pelvic: External genitalia:  no lesions              Urethra:  normal appearing urethra with no masses, tenderness or lesions              Bartholins and Skenes: normal                 Vagina: normal appearing vagina with normal color and discharge, no lesions              Cervix: no lesions                Bimanual Exam:  Uterus:  normal size, contour, position, consistency, mobility, non-tender              Adnexa: no mass, fullness, tenderness                Chaperone was present for exam.   ASSESSMENT   Endometrial polyp.  Menorrhagia with irregular menses.  On Aygestin 5 mg po bid.    PLAN   Discussion of hysteroscopy with Myosure polypectomy, dilation and curettage.  Risks, benefits, and alternatives reviewed. Risks include but are not limited to bleeding, infection, damage to surrounding organs including uterine perforation requiring hospitalization and laparoscopy, pulmonary edema, reaction to anesthesia, DVT, PE, death, need for further treatment and surgery including hysterectomy or medical therapy.   The possibility of negative findings also reviewed. Surgical expectations and  recovery discussed.  Patient wishes to proceed.   Start taking Aygestin 5 mg nightly on the day of surgery.  Will do this for one month and then stop.  She has enough on hand and does not need refills.    35 min  total time was spent for this patient encounter, including preparation, face-to-face counseling with the patient, coordination of care, and documentation of the encounter.

## 2020-09-25 NOTE — Anesthesia Preprocedure Evaluation (Addendum)
Anesthesia Evaluation  Patient identified by MRN, date of birth, ID band Patient awake    Reviewed: Allergy & Precautions, NPO status , Patient's Chart, lab work & pertinent test results  Airway Mallampati: I  TM Distance: >3 FB Neck ROM: Full    Dental no notable dental hx. (+) Dental Advisory Given   Pulmonary neg pulmonary ROS,    Pulmonary exam normal breath sounds clear to auscultation       Cardiovascular negative cardio ROS Normal cardiovascular exam Rhythm:Regular Rate:Normal     Neuro/Psych  Headaches, PSYCHIATRIC DISORDERS Anxiety Depression    GI/Hepatic negative GI ROS, Neg liver ROS,   Endo/Other  negative endocrine ROS  Renal/GU negative Renal ROS     Musculoskeletal negative musculoskeletal ROS (+)   Abdominal   Peds  Hematology  (+) Blood dyscrasia, anemia ,   Anesthesia Other Findings   Reproductive/Obstetrics                            Anesthesia Physical Anesthesia Plan  ASA: II  Anesthesia Plan: General   Post-op Pain Management:    Induction: Intravenous  PONV Risk Score and Plan: Ondansetron, Dexamethasone, Midazolam, Scopolamine patch - Pre-op and Treatment may vary due to age or medical condition  Airway Management Planned: LMA  Additional Equipment: None  Intra-op Plan:   Post-operative Plan: Extubation in OR  Informed Consent: I have reviewed the patients History and Physical, chart, labs and discussed the procedure including the risks, benefits and alternatives for the proposed anesthesia with the patient or authorized representative who has indicated his/her understanding and acceptance.     Dental advisory given  Plan Discussed with: CRNA  Anesthesia Plan Comments:        Anesthesia Quick Evaluation

## 2020-09-25 NOTE — Progress Notes (Addendum)
GYNECOLOGY  VISIT   HPI: 55 y.o.   Married  Caucasian  female   G2P2 with Patient's last menstrual period was 08/09/2020.   here for pre op     Is on Aygestin bid to control heavy and irregular menses.  She has had no further bleeding.   Pelvic ultrasound ultrasound on 06/10/20 showed a thickened endometrium with a feeder vessel. Sonohysterogram on 07/26/19 showed 4 fibroids with the largest being 1.69 cm, a 4.2 cm right ovarian cyst, and a thickening in the central portion of the uterine cavity. Endometrial biopsy on 07/26/19 showed a benign endometrial polyp.  Her last pelvic US showed on 09/12/20 showed her left ovarian cyst has resolved.  EMS was 15.45 mm.  The fibroids are still present.   Having left hip sciatica and has been seeing a [facility].   GYNECOLOGIC HISTORY: Patient's last menstrual period was 08/09/2020. Contraception:  Vasectomy Menopausal hormone therapy:  none Last mammogram:  08/11/20 BIRADS 1 negative Last pap smear:   08/20/19 Neg:Neg HR HPV         08/04/15 Neg:Neg HR HPV        OB History    Gravida  2   Para  2   Term      Preterm      AB      Living  2     SAB      IAB      Ectopic      Multiple      Live Births                 Patient Active Problem List   Diagnosis Date Noted  . Menorrhagia 12/21/2013  . 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 ANXIETY 03/16/2007  . DEPRESSION 03/16/2007  . HEADACHE 03/16/2007    Past Medical History:  Diagnosis Date  . Anxiety   . Endometrial polyp   . History of 2019 novel coronavirus disease (COVID-19) 05/2020   per pt had mild symptoms that resolved  . IDA (iron deficiency anemia)    09-20-2020  per pt Hg as been normal in past few yrs, but has been on oral medication and iv rron infusions  . Menorrhagia with irregular cycle   . PMS (premenstrual syndrome)   . Seasonal allergies   . Wears glasses     Past Surgical History:  Procedure Laterality Date  . COLONOSCOPY WITH PROPOFOL  approx. 2018   . WISDOM TOOTH EXTRACTION Bilateral age 65    Current Outpatient Medications  Medication Sig Dispense Refill  . Calcium Carbonate-Vitamin D (CALCIUM + D PO) Take 1 tablet by mouth daily.    Marland Kitchen escitalopram (LEXAPRO) 10 MG tablet Take 5 mg by mouth daily.   5  . ibuprofen (ADVIL) 200 MG tablet Take 200 mg by mouth every 6 (six) hours as needed.    . Multiple Vitamin (MULTIVITAMIN) capsule Take 1 capsule by mouth daily.    . Multiple Vitamins-Minerals (VITAMIN D3 COMPLETE PO) Take 1,000 Units by mouth daily.    . norethindrone (AYGESTIN) 5 MG tablet TAKE 1 TABLET (5 MG TOTAL) BY MOUTH IN THE MORNING AND AT BEDTIME. 60 tablet 0   No current facility-administered medications for this visit.     ALLERGIES: Patient has no known allergies.  Family History  Problem Relation Age of Onset  . Hyperlipidemia Mother   . Hypertension Mother   . Cancer - Lung Mother 53       stage IV non small cell,  chemo starting 5/14  . COPD Father   . Colon polyps Father   . Colon cancer Neg Hx   . Rectal cancer Neg Hx   . Stomach cancer Neg Hx     Social History   Socioeconomic History  . Marital status: Married    Spouse name: Not on file  . Number of children: 2  . Years of education: Not on file  . Highest education level: Not on file  Occupational History    Employer: 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 Smoking status: Never Smoker  . Smokeless tobacco: Never Used  Vaping Use  . Vaping Use: Never used  Substance and Sexual Activity  . Alcohol use: Yes    Alcohol/week: 3.0 standard drinks    Types: 3 Glasses of wine per week  . Drug use: No  . Sexual activity: Yes    Partners: Male    Birth control/protection: Other-see comments    Comment: Husband vasectomy  Other Topics Concern  . Not on file  Social History Narrative  . Not on file   Social Determinants of Health   Financial Resource Strain: Not on file  Food Insecurity: Not on file  Transportation Needs: Not on  file  Physical Activity: Not on file  Stress: Not on file  Social Connections: Not on file  Intimate Partner Violence: Not on file    Review of Systems  Constitutional: Negative.   HENT: Negative.   Eyes: Negative.   Respiratory: Negative.   Cardiovascular: Negative.   Gastrointestinal: Negative.   Endocrine: Negative.   Genitourinary: Negative.   Musculoskeletal: Negative.   Skin: Negative.   Allergic/Immunologic: Negative.   Neurological: Negative.   Hematological: Negative.   Psychiatric/Behavioral: Negative.     PHYSICAL EXAMINATION:    BP 132/70   Pulse 79   Wt 131 lb (59.4 kg)   LMP 08/09/2020 Comment: per pt on medication to have menses  SpO2 100%   BMI 24.75 kg/m     General appearance: alert, cooperative and appears stated age Head: Normocephalic, without obvious abnormality, atraumatic Lungs: clear to auscultation bilaterally Heart: regular rate and rhythm Abdomen: soft, non-tender, no masses,  no organomegaly Extremities: extremities normal, atraumatic, no cyanosis or edema Skin: Skin color, texture, turgor normal. No rashes or lesions No abnormal inguinal nodes palpated Neurologic: Grossly normal  Pelvic: External genitalia:  no lesions              Urethra:  normal appearing urethra with no masses, tenderness or lesions              Bartholins and Skenes: normal                 Vagina: normal appearing vagina with normal color and discharge, no lesions              Cervix: no lesions                Bimanual Exam:  Uterus:  normal size, contour, position, consistency, mobility, non-tender              Adnexa: no mass, fullness, tenderness               Chaperone was present for exam.  ASSESSMENT  Endometrial polyp.  Menorrhagia with irregular menses.  On Aygestin 5 mg po bid.   PLAN  Discussion of hysteroscopy with Myosure polypectomy, dilation and curettage.  Risks, benefits, and alternatives reviewed. Risks include but are not  limited to  bleeding, infection, damage to surrounding organs including uterine perforation requiring hospitalization and laparoscopy, pulmonary edema, reaction to anesthesia, DVT, PE, death, need for further treatment and surgery including hysterectomy or medical therapy.   The possibility of negative findings also reviewed. Surgical expectations and recovery discussed.  Patient wishes to proceed.  Start taking Aygestin 5 mg nightly on the day of surgery.  Will do this for one month and then stop.  She has enough on hand and does not need refills.   35 min  total time was spent for this patient encounter, including preparation, face-to-face counseling with the patient, coordination of care, and documentation of the encounter.

## 2020-09-25 NOTE — Addendum Note (Signed)
Addended by: Ardell Isaacs, Debbe Bales E on: 09/25/2020 01:12 PM   Modules accepted: Orders

## 2020-09-26 ENCOUNTER — Ambulatory Visit (HOSPITAL_BASED_OUTPATIENT_CLINIC_OR_DEPARTMENT_OTHER): Payer: BC Managed Care – PPO | Admitting: Anesthesiology

## 2020-09-26 ENCOUNTER — Ambulatory Visit (HOSPITAL_BASED_OUTPATIENT_CLINIC_OR_DEPARTMENT_OTHER)
Admission: RE | Admit: 2020-09-26 | Discharge: 2020-09-26 | Disposition: A | Discharge status: Home / Self Care | Payer: BC Managed Care – PPO | Attending: Obstetrics and Gynecology | Admitting: Obstetrics and Gynecology

## 2020-09-26 ENCOUNTER — Encounter (HOSPITAL_BASED_OUTPATIENT_CLINIC_OR_DEPARTMENT_OTHER): Payer: Self-pay | Admitting: Obstetrics and Gynecology

## 2020-09-26 ENCOUNTER — Encounter (HOSPITAL_BASED_OUTPATIENT_CLINIC_OR_DEPARTMENT_OTHER)
Admission: RE | Disposition: A | Discharge status: Home / Self Care | Payer: Self-pay | Source: Home / Self Care | Attending: Obstetrics and Gynecology

## 2020-09-26 DIAGNOSIS — Z79899 Other long term (current) drug therapy: Secondary | ICD-10-CM | POA: Diagnosis not present

## 2020-09-26 DIAGNOSIS — Z8616 Personal history of COVID-19: Secondary | ICD-10-CM | POA: Insufficient documentation

## 2020-09-26 DIAGNOSIS — Z801 Family history of malignant neoplasm of trachea, bronchus and lung: Secondary | ICD-10-CM | POA: Insufficient documentation

## 2020-09-26 DIAGNOSIS — N921 Excessive and frequent menstruation with irregular cycle: Secondary | ICD-10-CM | POA: Insufficient documentation

## 2020-09-26 DIAGNOSIS — Z8371 Family history of colonic polyps: Secondary | ICD-10-CM | POA: Diagnosis not present

## 2020-09-26 DIAGNOSIS — N840 Polyp of corpus uteri: Secondary | ICD-10-CM | POA: Diagnosis not present

## 2020-09-26 DIAGNOSIS — Z825 Family history of asthma and other chronic lower respiratory diseases: Secondary | ICD-10-CM | POA: Insufficient documentation

## 2020-09-26 DIAGNOSIS — Z8249 Family history of ischemic heart disease and other diseases of the circulatory system: Secondary | ICD-10-CM | POA: Insufficient documentation

## 2020-09-26 DIAGNOSIS — Z8349 Family history of other endocrine, nutritional and metabolic diseases: Secondary | ICD-10-CM | POA: Diagnosis not present

## 2020-09-26 DIAGNOSIS — F418 Other specified anxiety disorders: Secondary | ICD-10-CM | POA: Diagnosis not present

## 2020-09-26 HISTORY — DX: Polyp of corpus uteri: N84.0

## 2020-09-26 HISTORY — PX: DILATATION & CURETTAGE/HYSTEROSCOPY WITH MYOSURE: SHX6511

## 2020-09-26 HISTORY — DX: Excessive and frequent menstruation with irregular cycle: N92.1

## 2020-09-26 HISTORY — DX: Presence of spectacles and contact lenses: Z97.3

## 2020-09-26 HISTORY — DX: Other seasonal allergic rhinitis: J30.2

## 2020-09-26 HISTORY — DX: Iron deficiency anemia, unspecified: D50.9

## 2020-09-26 LAB — BASIC METABOLIC PANEL
Anion gap: 5 (ref 5–15)
BUN: 21 mg/dL — ABNORMAL HIGH (ref 6–20)
CO2: 20 mmol/L — ABNORMAL LOW (ref 22–32)
Calcium: 8.7 mg/dL — ABNORMAL LOW (ref 8.9–10.3)
Chloride: 110 mmol/L (ref 98–111)
Creatinine, Ser: 0.74 mg/dL (ref 0.44–1.00)
GFR, Estimated: >60 mL/min (ref >60)
Glucose, Bld: 101 mg/dL — ABNORMAL HIGH (ref 70–99)
Potassium: 4.1 mmol/L (ref 3.5–5.1)
Sodium: 135 mmol/L (ref 135–145)

## 2020-09-26 LAB — CBC
HCT: 36.4 % (ref 36.0–46.0)
Hemoglobin: 11.3 g/dL — ABNORMAL LOW (ref 12.0–15.0)
MCH: 27.3 pg (ref 26.0–34.0)
MCHC: 31 g/dL (ref 30.0–36.0)
MCV: 87.9 fL (ref 80.0–100.0)
Platelets: 307 10*3/uL (ref 150–400)
RBC: 4.14 MIL/uL (ref 3.87–5.11)
RDW: 13.2 % (ref 11.5–15.5)
WBC: 5 10*3/uL (ref 4.0–10.5)
nRBC: 0 % (ref 0.0–0.2)

## 2020-09-26 LAB — POCT PREGNANCY, URINE: Preg Test, Ur: NEGATIVE

## 2020-09-26 SURGERY — DILATATION & CURETTAGE/HYSTEROSCOPY WITH MYOSURE
Anesthesia: General | Site: Uterus

## 2020-09-26 MED ORDER — OXYCODONE HCL 5 MG/5ML PO SOLN: 5.0000 mg | Freq: Once | ORAL | Status: DC | PRN

## 2020-09-26 MED ORDER — LACTATED RINGERS IV SOLN: INTRAVENOUS | Status: DC

## 2020-09-26 MED ORDER — PROPOFOL 10 MG/ML IV BOLUS
INTRAVENOUS | Status: DC | PRN
  Administered 2020-09-26: 120 mg via INTRAVENOUS

## 2020-09-26 MED ORDER — FENTANYL CITRATE (PF) 100 MCG/2ML IJ SOLN
INTRAMUSCULAR | Status: AC
  Filled 2020-09-26: qty 2

## 2020-09-26 MED ORDER — DEXAMETHASONE SODIUM PHOSPHATE 10 MG/ML IJ SOLN
INTRAMUSCULAR | Status: DC | PRN
  Administered 2020-09-26: 8 mg via INTRAVENOUS

## 2020-09-26 MED ORDER — ONDANSETRON HCL 4 MG/2ML IJ SOLN
INTRAMUSCULAR | Status: DC | PRN
  Administered 2020-09-26: 4 mg via INTRAVENOUS

## 2020-09-26 MED ORDER — FENTANYL CITRATE (PF) 100 MCG/2ML IJ SOLN
INTRAMUSCULAR | Status: DC | PRN
  Administered 2020-09-26: 50 ug via INTRAVENOUS

## 2020-09-26 MED ORDER — PROMETHAZINE HCL 25 MG/ML IJ SOLN: 6.2500 mg | INTRAMUSCULAR | Status: DC | PRN

## 2020-09-26 MED ORDER — EPHEDRINE 5 MG/ML INJ
INTRAVENOUS | Status: AC
  Filled 2020-09-26: qty 10

## 2020-09-26 MED ORDER — MIDAZOLAM HCL 2 MG/2ML IJ SOLN
INTRAMUSCULAR | Status: AC
  Filled 2020-09-26: qty 2

## 2020-09-26 MED ORDER — FENTANYL CITRATE (PF) 100 MCG/2ML IJ SOLN: 25.0000 ug | INTRAMUSCULAR | Status: DC | PRN

## 2020-09-26 MED ORDER — LIDOCAINE 2% (20 MG/ML) 5 ML SYRINGE
INTRAMUSCULAR | Status: DC | PRN
  Administered 2020-09-26: 80 mg via INTRAVENOUS

## 2020-09-26 MED ORDER — SODIUM CHLORIDE 0.9 % IR SOLN
Status: DC | PRN
  Administered 2020-09-26: 2000 mL

## 2020-09-26 MED ORDER — EPHEDRINE SULFATE-NACL 50-0.9 MG/10ML-% IV SOSY
PREFILLED_SYRINGE | INTRAVENOUS | Status: DC | PRN
  Administered 2020-09-26 (×2): 5 mg via INTRAVENOUS

## 2020-09-26 MED ORDER — IBUPROFEN 800 MG PO TABS: 800.0000 mg | ORAL_TABLET | Freq: Three times a day (TID) | ORAL | 0 refills | Status: DC | PRN

## 2020-09-26 MED ORDER — ONDANSETRON HCL 4 MG/2ML IJ SOLN
INTRAMUSCULAR | Status: AC
  Filled 2020-09-26: qty 2

## 2020-09-26 MED ORDER — POVIDONE-IODINE 10 % EX SWAB: 2.0000 "application " | Freq: Once | CUTANEOUS | Status: DC

## 2020-09-26 MED ORDER — PROPOFOL 10 MG/ML IV BOLUS
INTRAVENOUS | Status: AC
  Filled 2020-09-26: qty 20

## 2020-09-26 MED ORDER — SCOPOLAMINE 1 MG/3DAYS TD PT72
MEDICATED_PATCH | TRANSDERMAL | Status: DC | PRN
  Administered 2020-09-26: 1 via TRANSDERMAL

## 2020-09-26 MED ORDER — OXYCODONE HCL 5 MG PO TABS: 5.0000 mg | ORAL_TABLET | Freq: Once | ORAL | Status: DC | PRN

## 2020-09-26 MED ORDER — LIDOCAINE HCL 1 % IJ SOLN
INTRAMUSCULAR | Status: DC | PRN
  Administered 2020-09-26: 10 mL

## 2020-09-26 MED ORDER — MEPERIDINE HCL 25 MG/ML IJ SOLN: 6.2500 mg | INTRAMUSCULAR | Status: DC | PRN

## 2020-09-26 MED ORDER — SILVER NITRATE-POT NITRATE 75-25 % EX MISC
CUTANEOUS | Status: DC | PRN
  Administered 2020-09-26: 4

## 2020-09-26 MED ORDER — MIDAZOLAM HCL 5 MG/5ML IJ SOLN
INTRAMUSCULAR | Status: DC | PRN
  Administered 2020-09-26: 2 mg via INTRAVENOUS

## 2020-09-26 MED ORDER — DEXAMETHASONE SODIUM PHOSPHATE 10 MG/ML IJ SOLN
INTRAMUSCULAR | Status: AC
  Filled 2020-09-26: qty 1

## 2020-09-26 MED ORDER — SCOPOLAMINE 1 MG/3DAYS TD PT72
MEDICATED_PATCH | TRANSDERMAL | Status: AC
  Filled 2020-09-26: qty 1

## 2020-09-26 SURGICAL SUPPLY — 17 items
CATH ROBINSON RED A/P 16FR (CATHETERS) ×2 IMPLANT
COVER WAND RF STERILE (DRAPES) ×2 IMPLANT
DEVICE MYOSURE LITE (MISCELLANEOUS) ×1 IMPLANT
DEVICE MYOSURE REACH (MISCELLANEOUS) IMPLANT
DILATOR CANAL MILEX (MISCELLANEOUS) IMPLANT
GAUZE 4X4 16PLY RFD (DISPOSABLE) ×2 IMPLANT
GLOVE SURG ENC MOIS LTX SZ6.5 (GLOVE) ×2 IMPLANT
GOWN STRL REUS W/TWL LRG LVL3 (GOWN DISPOSABLE) ×2 IMPLANT
IV NS IRRIG 3000ML ARTHROMATIC (IV SOLUTION) ×2 IMPLANT
KIT PROCEDURE FLUENT (KITS) ×2 IMPLANT
KIT TURNOVER CYSTO (KITS) ×2 IMPLANT
MYOSURE XL FIBROID (MISCELLANEOUS)
PACK VAGINAL MINOR WOMEN LF (CUSTOM PROCEDURE TRAY) ×2 IMPLANT
PAD OB MATERNITY 4.3X12.25 (PERSONAL CARE ITEMS) ×2 IMPLANT
SEAL CERVICAL OMNI LOK (ABLATOR) IMPLANT
SEAL ROD LENS SCOPE MYOSURE (ABLATOR) ×2 IMPLANT
SYSTEM TISS REMOVAL MYOSURE XL (MISCELLANEOUS) IMPLANT

## 2020-09-26 NOTE — Discharge Instructions (Signed)

## 2020-09-26 NOTE — Anesthesia Postprocedure Evaluation (Signed)
Anesthesia Post Note  Patient: Desiree Garcia  Procedure(s) Performed: DILATATION & CURETTAGE/HYSTEROSCOPY WITH MYOSURE/resection of endometrial poly[ (N/A Uterus)     Patient location during evaluation: PACU Anesthesia Type: General Level of consciousness: sedated and patient cooperative Pain management: pain level controlled Vital Signs Assessment: post-procedure vital signs reviewed and stable Respiratory status: spontaneous breathing Cardiovascular status: stable Anesthetic complications: no   No complications documented.  Last Vitals:  Vitals:   09/26/20 0845 09/26/20 0904  BP: 131/74 (!) 149/86  Pulse: 77 77  Resp: 19 12  Temp: 36.6 C 36.8 C  SpO2: 100% 100%    Last Pain:  Vitals:   09/26/20 0904  TempSrc:   PainSc: 0-No pain                 Lewie Loron

## 2020-09-26 NOTE — Anesthesia Procedure Notes (Signed)
Procedure Name: LMA Insertion Date/Time: 09/26/2020 7:33 AM Performed by: Elisabeth Cara, CRNA Pre-anesthesia Checklist: Patient identified, Emergency Drugs available, Suction available, Patient being monitored and Timeout performed Patient Re-evaluated:Patient Re-evaluated prior to induction Oxygen Delivery Method: Circle system utilized Preoxygenation: Pre-oxygenation with 100% oxygen Induction Type: IV induction Ventilation: Mask ventilation without difficulty LMA: LMA with gastric port inserted LMA Size: 4.0 Number of attempts: 1 Placement Confirmation: positive ETCO2 and breath sounds checked- equal and bilateral Tube secured with: Tape Dental Injury: Teeth and Oropharynx as per pre-operative assessment

## 2020-09-26 NOTE — Progress Notes (Signed)
Update to History and Physical  No marked change in status since office preop visit.  Patient examined.  Hgb 11.3. OK to proceed with surgery.

## 2020-09-26 NOTE — Op Note (Signed)
OPERATIVE REPORT  PREOPERATIVE DIAGNOSES:   Menorrhagia with irregular menses, endometrial polyp  POSTOPERATIVE DIAGNOSES:   Menorrhagia with irregular menses, endometrial polyps  PROCEDURE:  Hysteroscopy with dilation and curettage and Myosure resection of endometrial polyps  SURGEON:  Randye Lobo, MD  ANESTHESIA:  LMA, paracervical block with 10 mL of 1% lidocaine.  IV FLUIDS:  400 cc LR  EBL:   10 cc  URINE OUTPUT:   25 cc  NORMAL SALINE DEFICIT:    115 cc  COMPLICATIONS:  None.  INDICATIONS FOR THE PROCEDURE:     The patient is a 55 year old G2P2 Caucasian female who presents with menorrhagia with irregular menses.  Pelvic ultrasound revealed a thickened endometrium and a feeder vessel and fibroid formation.  Sonohysterogram revealed thickening of the central canal of the uterus and endometrial biopsy revealed a benign endometrial polyp.  The patient has been treated with Aygestin to control bleeding.   A plan is now made to proceed with a hysteroscopy with dilation and curettage and Myosurgical resection of endometrial polyp; after risks, benefits and alternatives were reviewed.  FINDINGS:  Exam under anesthesia revealed a small anteverted, mobile uterus.  No adnexal masses were noted.  The uterus was sounded to 10 cm. Hysteroscopy showed multiple small polypoid lesions of the endometrium.  No fibroids were noted.  The tubal ostia regions were visualized.   Endometrial currettings were scant.   SPECIMENS:   Endometrial polyps and endometrial curettings were sent to pathology separately.  PROCEDURE IN DETAIL:  The patient was reidentified in the preoperative hold area.  She received TED hose and PAS stockings for DVT prophylaxis.  In the operating room, the patient was placed in the dorsal lithotomy position and then an LMA anesthetic was introduced.    An exam under anesthesia was performed.  The patient's lower abdomen, vagina and perineum were sterilely prepped  with betadine and the patient's bladder was catheterized of urine.  She was sterilly draped.  A speculum was placed inside the vagina and a single-tooth tenaculum was placed on the anterior cervical lip.  A paracervical block was performed with a total of 10 mL of 1% lidocaine plain.  The uterus was sounded. The cervix was dilated to a #19 Pratt dilator.  The MyoSure hysteroscope was then inserted inside the uterine cavity under the continuous infusion of normal saline solution.  Findings are as noted above.  The Lite Myosure device was used to resect the endometrial polyps without difficulty.  The tissue was sent to pathology.   The serrated curette was introduced into the uterine cavity and the endometrium was curetted in all 4 quadrants.  A scant amount of endometrial curettings was obtained.  This specimen was sent to pathology.  The single-tooth tenaculum which had been placed on the anterior cervical lip was removed.  Silver nitrate sticks were used to treat bleeding from the tenaculum sites.  Hemostasis was then good, and all of the vaginal instruments were removed.  The patient was awakened and escorted to the recovery room in stable condition after she was cleansed of Betadine.  There were no complications to the procedure.    All needle, instrument and sponge counts were correct.  Randye Lobo, MD

## 2020-09-26 NOTE — Transfer of Care (Signed)
Immediate Anesthesia Transfer of Care Note  Patient: Desiree Garcia  Procedure(s) Performed: DILATATION & CURETTAGE/HYSTEROSCOPY WITH MYOSURE/resection of endometrial poly[ (N/A Uterus)  Patient Location: PACU  Anesthesia Type:General  Level of Consciousness: awake, alert , oriented and patient cooperative  Airway & Oxygen Therapy: Patient Spontanous Breathing and Patient connected to face mask oxygen  Post-op Assessment: Report given to RN, Post -op Vital signs reviewed and stable and Patient moving all extremities  Post vital signs: Reviewed and stable  Last Vitals:  Vitals Value Taken Time  BP 137/78 09/26/20 0819  Temp    Pulse 77 09/26/20 0821  Resp 14 09/26/20 0821  SpO2 100 % 09/26/20 0821  Vitals shown include unvalidated device data.  Last Pain:  Vitals:   09/26/20 0605  TempSrc: Oral  PainSc: 0-No pain      Patients Stated Pain Goal: 5 (09/26/20 2800)  Complications: No complications documented.

## 2020-09-27 ENCOUNTER — Encounter (HOSPITAL_BASED_OUTPATIENT_CLINIC_OR_DEPARTMENT_OTHER): Payer: Self-pay | Admitting: Obstetrics and Gynecology

## 2020-09-27 LAB — SURGICAL PATHOLOGY

## 2020-10-02 ENCOUNTER — Other Ambulatory Visit: Payer: Self-pay | Admitting: Obstetrics & Gynecology

## 2020-10-12 ENCOUNTER — Ambulatory Visit: Payer: Self-pay | Admitting: Obstetrics and Gynecology

## 2020-10-17 DIAGNOSIS — M9903 Segmental and somatic dysfunction of lumbar region: Secondary | ICD-10-CM | POA: Diagnosis not present

## 2020-10-17 DIAGNOSIS — M5432 Sciatica, left side: Secondary | ICD-10-CM | POA: Diagnosis not present

## 2020-10-18 ENCOUNTER — Encounter: Payer: Self-pay | Admitting: Obstetrics and Gynecology

## 2020-10-18 ENCOUNTER — Other Ambulatory Visit: Payer: Self-pay

## 2020-10-18 ENCOUNTER — Ambulatory Visit (INDEPENDENT_AMBULATORY_CARE_PROVIDER_SITE_OTHER): Payer: BC Managed Care – PPO | Admitting: Obstetrics and Gynecology

## 2020-10-18 VITALS — BP 136/70 | HR 70 | Ht 60.75 in | Wt 130.0 lb

## 2020-10-18 DIAGNOSIS — Z9889 Other specified postprocedural states: Secondary | ICD-10-CM

## 2020-10-18 NOTE — Progress Notes (Signed)
GYNECOLOGY  VISIT   HPI: 55 y.o.   Married  Caucasian  female   G2P2 with No LMP recorded.   here for 3 weeks status post DILATATION & CURETTAGE/HYSTEROSCOPY WITH MYOSURE/resection of endometrial poly[ (N/A Uterus).  Patient has been bleeding daily since surgery. Bleeding is light but has clotting.  Today seems lighter but is bright red.  Pad change once today but otherwise 3 - 4 times a day.   No pain of cramping.   On Aygestin 5 mg at night since surgery. She was on Aygestin 5 mg bid prior to surgery.  Does not like the bloating, increased appetite, and mood swings.  Has some night time hot flashes.  Took birth control many years ago.  Did not like weight gain.   Denies HTN, smoking.  Has aura with migraine.   GYNECOLOGIC HISTORY: No LMP recorded. Contraception:  Vasectomy Menopausal hormone therapy:  none Last mammogram:  08/11/20 BIRADS 1 negative Last pap smear:  08/20/19 Neg:Neg HR HPV,  08/04/15 Neg:Neg HR HPV        OB History    Gravida  2   Para  2   Term      Preterm      AB      Living  2     SAB      IAB      Ectopic      Multiple      Live Births                 Patient Active Problem List   Diagnosis Date Noted  . Menorrhagia 12/21/2013  . 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 ANXIETY 03/16/2007  . DEPRESSION 03/16/2007  . HEADACHE 03/16/2007    Past Medical History:  Diagnosis Date  . Anxiety   . Endometrial polyp   . History of 2019 novel coronavirus disease (COVID-19) 05/2020   per pt had mild symptoms that resolved  . IDA (iron deficiency anemia)    09-20-2020  per pt Hg as been normal in past few yrs, but has been on oral medication and iv rron infusions  . Menorrhagia with irregular cycle   . Migraine    with aura  . PMS (premenstrual syndrome)   . Seasonal allergies   . Wears glasses     Past Surgical History:  Procedure Laterality Date  . COLONOSCOPY WITH PROPOFOL  approx. 2018  . DILATATION & CURETTAGE/HYSTEROSCOPY WITH  MYOSURE N/A 09/26/2020   Procedure: DILATATION & CURETTAGE/HYSTEROSCOPY WITH MYOSURE/resection of endometrial poly[;  Surgeon: Patton Salles, MD;  Location: [facility];  Service: Gynecology;  Laterality: N/A;  . WISDOM TOOTH EXTRACTION Bilateral age 71    Current Outpatient Medications  Medication Sig Dispense Refill  . Calcium Carbonate-Vitamin D (CALCIUM + D PO) Take 1 tablet by mouth daily.    Marland Kitchen escitalopram (LEXAPRO) 10 MG tablet Take 5 mg by mouth daily.   5  . Multiple Vitamin (MULTIVITAMIN) capsule Take 1 capsule by mouth daily.    . Multiple Vitamins-Minerals (VITAMIN D3 COMPLETE PO) Take 1,000 Units by mouth daily.    . norethindrone (AYGESTIN) 5 MG tablet Take 1 tablet (5 mg total) by mouth at bedtime. 30 tablet 0   No current facility-administered medications for this visit.     ALLERGIES: Patient has no known allergies.  Family History  Problem Relation Age of Onset  . Hyperlipidemia Mother   . Hypertension Mother   . Cancer - Lung Mother  72       stage IV non small cell, chemo starting 5/14  . COPD Father   . Colon polyps Father   . Colon cancer Neg Hx   . Rectal cancer Neg Hx   . Stomach cancer Neg Hx     Social History   Socioeconomic History  . Marital status: Married    Spouse name: Not on file  . Number of children: 2  . Years of education: Not on file  . Highest education level: Not on file  Occupational History    Employer: 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 Smoking status: Never Smoker  . Smokeless tobacco: Never Used  Vaping Use  . Vaping Use: Never used  Substance and Sexual Activity  . Alcohol use: Yes    Alcohol/week: 3.0 standard drinks    Types: 3 Glasses of wine per week  . Drug use: No  . Sexual activity: Yes    Partners: Male    Birth control/protection: Other-see comments    Comment: Husband vasectomy  Other Topics Concern  . Not on file  Social History Narrative  . Not on file   Social  Determinants of Health   Financial Resource Strain: Not on file  Food Insecurity: Not on file  Transportation Needs: Not on file  Physical Activity: Not on file  Stress: Not on file  Social Connections: Not on file  Intimate Partner Violence: Not on file    Review of Systems  All other systems reviewed and are negative.   PHYSICAL EXAMINATION:    BP 136/70   Pulse 70   Ht 5' 0.75" (1.543 m)   Wt 130 lb (59 kg)   SpO2 100%   BMI 24.77 kg/m     General appearance: alert, cooperative and appears stated age   Pelvic: External genitalia:  no lesions              Urethra:  normal appearing urethra with no masses, tenderness or lesions              Bartholins and Skenes: normal                 Vagina: normal appearing vagina with normal color and discharge, no lesions              Cervix: no lesions.  Light vaginal bleeding.                 Bimanual Exam:  Uterus:  normal size, contour, position, consistency, mobility, non-tender              Adnexa: no mass, fullness, tenderness              Chaperone was present for exam.  ASSESSMENT  Status post hysteroscopy, Myosure polypectomy, dilation and curettage. History of migraine with aura.  PLAN  Surgical procedure, findings, and pathology report discussed.  Stop Aygestin this weekend.  If bleeds again, consider Mirena IUD. Take MVI with iron.  Fu for annual exam in June, 2022.

## 2020-10-31 DIAGNOSIS — M5432 Sciatica, left side: Secondary | ICD-10-CM | POA: Diagnosis not present

## 2020-10-31 DIAGNOSIS — M9903 Segmental and somatic dysfunction of lumbar region: Secondary | ICD-10-CM | POA: Diagnosis not present

## 2020-11-16 DIAGNOSIS — M5432 Sciatica, left side: Secondary | ICD-10-CM | POA: Diagnosis not present

## 2020-11-16 DIAGNOSIS — M9903 Segmental and somatic dysfunction of lumbar region: Secondary | ICD-10-CM | POA: Diagnosis not present

## 2020-11-17 ENCOUNTER — Ambulatory Visit: Payer: BC Managed Care – PPO

## 2021-02-06 DIAGNOSIS — M9903 Segmental and somatic dysfunction of lumbar region: Secondary | ICD-10-CM | POA: Diagnosis not present

## 2021-02-06 DIAGNOSIS — M5432 Sciatica, left side: Secondary | ICD-10-CM | POA: Diagnosis not present

## 2021-02-27 DIAGNOSIS — M5432 Sciatica, left side: Secondary | ICD-10-CM | POA: Diagnosis not present

## 2021-02-27 DIAGNOSIS — M9903 Segmental and somatic dysfunction of lumbar region: Secondary | ICD-10-CM | POA: Diagnosis not present

## 2021-03-29 DIAGNOSIS — M5432 Sciatica, left side: Secondary | ICD-10-CM | POA: Diagnosis not present

## 2021-03-29 DIAGNOSIS — M9903 Segmental and somatic dysfunction of lumbar region: Secondary | ICD-10-CM | POA: Diagnosis not present

## 2021-04-12 DIAGNOSIS — M5432 Sciatica, left side: Secondary | ICD-10-CM | POA: Diagnosis not present

## 2021-04-12 DIAGNOSIS — M9903 Segmental and somatic dysfunction of lumbar region: Secondary | ICD-10-CM | POA: Diagnosis not present

## 2021-05-02 DIAGNOSIS — E785 Hyperlipidemia, unspecified: Secondary | ICD-10-CM | POA: Diagnosis not present

## 2021-05-09 DIAGNOSIS — M9903 Segmental and somatic dysfunction of lumbar region: Secondary | ICD-10-CM | POA: Diagnosis not present

## 2021-05-09 DIAGNOSIS — M5432 Sciatica, left side: Secondary | ICD-10-CM | POA: Diagnosis not present

## 2021-05-11 DIAGNOSIS — Z1339 Encounter for screening examination for other mental health and behavioral disorders: Secondary | ICD-10-CM | POA: Diagnosis not present

## 2021-05-11 DIAGNOSIS — F411 Generalized anxiety disorder: Secondary | ICD-10-CM | POA: Diagnosis not present

## 2021-05-11 DIAGNOSIS — Z1331 Encounter for screening for depression: Secondary | ICD-10-CM | POA: Diagnosis not present

## 2021-05-11 DIAGNOSIS — Z23 Encounter for immunization: Secondary | ICD-10-CM | POA: Diagnosis not present

## 2021-05-11 DIAGNOSIS — R82998 Other abnormal findings in urine: Secondary | ICD-10-CM | POA: Diagnosis not present

## 2021-05-11 DIAGNOSIS — Z0000 Encounter for general adult medical examination without abnormal findings: Secondary | ICD-10-CM | POA: Diagnosis not present

## 2021-05-30 DIAGNOSIS — M9903 Segmental and somatic dysfunction of lumbar region: Secondary | ICD-10-CM | POA: Diagnosis not present

## 2021-05-30 DIAGNOSIS — M5432 Sciatica, left side: Secondary | ICD-10-CM | POA: Diagnosis not present

## 2021-06-20 DIAGNOSIS — M5432 Sciatica, left side: Secondary | ICD-10-CM | POA: Diagnosis not present

## 2021-06-20 DIAGNOSIS — M9903 Segmental and somatic dysfunction of lumbar region: Secondary | ICD-10-CM | POA: Diagnosis not present

## 2021-07-11 DIAGNOSIS — M9903 Segmental and somatic dysfunction of lumbar region: Secondary | ICD-10-CM | POA: Diagnosis not present

## 2021-07-11 DIAGNOSIS — M5432 Sciatica, left side: Secondary | ICD-10-CM | POA: Diagnosis not present

## 2021-07-17 DIAGNOSIS — D2261 Melanocytic nevi of right upper limb, including shoulder: Secondary | ICD-10-CM | POA: Diagnosis not present

## 2021-07-17 DIAGNOSIS — L718 Other rosacea: Secondary | ICD-10-CM | POA: Diagnosis not present

## 2021-07-17 DIAGNOSIS — L573 Poikiloderma of Civatte: Secondary | ICD-10-CM | POA: Diagnosis not present

## 2021-07-17 DIAGNOSIS — D225 Melanocytic nevi of trunk: Secondary | ICD-10-CM | POA: Diagnosis not present

## 2021-08-01 DIAGNOSIS — M9903 Segmental and somatic dysfunction of lumbar region: Secondary | ICD-10-CM | POA: Diagnosis not present

## 2021-08-01 DIAGNOSIS — M5432 Sciatica, left side: Secondary | ICD-10-CM | POA: Diagnosis not present

## 2021-09-05 DIAGNOSIS — M9903 Segmental and somatic dysfunction of lumbar region: Secondary | ICD-10-CM | POA: Diagnosis not present

## 2021-09-05 DIAGNOSIS — M5432 Sciatica, left side: Secondary | ICD-10-CM | POA: Diagnosis not present

## 2021-09-11 NOTE — Progress Notes (Signed)
56 y.o. G2P2 Married Caucasian female here for annual exam.    Having urinary urgency.  Having some lower abdominal bloating and cramping.  Took AZO last hs, and her symptoms are somewhat improved.   LMP 10/2020 and then began spotting 05-30-21 (spotting only).  Having night sweats.  Occasionally during the day. Tired.   Status post hysteroscopy with dilation and curettage for endometrial polyp one year ago.  Taking Lexapro 5 mg daily.   Has a new grandson.   PCP:   Janalyn Rouse, MD  Patient's last menstrual period was 05/30/2021 (exact date).           Sexually active: Yes.    The current method of family planning is vasectomy.    Exercising: No.  The patient does not participate in regular exercise at present. Smoker:  no  Health Maintenance: Pap:  08-20-19 Neg:Neg HR HPV, 08-04-15 Neg:Neg HR HPV, 08-16-11 Neg:Neg HR HPV History of abnormal Pap:  no MMG:  08-11-20 Neg/BiRads1--knows she needs to schedule Colonoscopy:   04/09/16 normal;next 10 years BMD:   n/a  Result  n/a TDaP:  05-11-21 Gardasil:   no HIV:Neg in preg--has donated blood in past Hep C:no Screening Labs:  PCP. Flu vaccine:  completed. Covid booster:  original series.  Shingrix:  not done.    reports that she has never smoked. She has never used smokeless tobacco. She reports current alcohol use of about 3.0 standard drinks per week. She reports that she does not use drugs.  Past Medical History:  Diagnosis Date   Anxiety    Endometrial polyp    History of 2019 novel coronavirus disease (COVID-19) 05/2020   per pt had mild symptoms that resolved   IDA (iron deficiency anemia)    09-20-2020  per pt Hg as been normal in past few yrs, but has been on oral medication and iv rron infusions   Menorrhagia with irregular cycle    Migraine    with aura   PMS (premenstrual syndrome)    Seasonal allergies    Wears glasses     Past Surgical History:  Procedure Laterality Date   COLONOSCOPY WITH PROPOFOL   approx. 2018   DILATATION & CURETTAGE/HYSTEROSCOPY WITH MYOSURE N/A 09/26/2020   Procedure: DILATATION & CURETTAGE/HYSTEROSCOPY WITH MYOSURE/resection of endometrial poly[;  Surgeon: Nunzio Cobbs, MD;  Location: [facility];  Service: Gynecology;  Laterality: N/A;   WISDOM TOOTH EXTRACTION Bilateral age 70    Current Outpatient Medications  Medication Sig Dispense Refill   ALPRAZolam (XANAX) 0.5 MG tablet 1 pill every 6 hours as needed for anxiety     Calcium Carbonate-Vitamin D (CALCIUM + D PO) Take 1 tablet by mouth daily.     escitalopram (LEXAPRO) 10 MG tablet Take 1 tablet by mouth daily.     Multiple Vitamin (MULTIVITAMIN) capsule Take 1 capsule by mouth daily.     Multiple Vitamins-Minerals (VITAMIN D3 COMPLETE PO) Take 1,000 Units by mouth daily.     No current facility-administered medications for this visit.    Family History  Problem Relation Age of Onset   Hyperlipidemia Mother    Hypertension Mother    Cancer - Lung Mother 6       stage IV non small cell, chemo starting 5/14   COPD Father    Colon polyps Father    Colon cancer Neg Hx    Rectal cancer Neg Hx    Stomach cancer Neg Hx  Review of Systems  Constitutional:  Positive for fatigue.  Genitourinary:  Positive for frequency.  Musculoskeletal:  Positive for myalgias.   Exam:   BP 138/80    Pulse 67    Ht 5\' 3"  (1.6 m)    Wt 124 lb (56.2 kg)    LMP 05/30/2021 (Exact Date)    SpO2 98%    BMI 21.97 kg/m     General appearance: alert, cooperative and appears stated age Head: normocephalic, without obvious abnormality, atraumatic Neck: no adenopathy, supple, symmetrical, trachea midline and thyroid normal to inspection and palpation Lungs: clear to auscultation bilaterally Breasts: normal appearance, no masses or tenderness, No nipple retraction or dimpling, No nipple discharge or bleeding, No axillary adenopathy Heart: regular rate and rhythm Abdomen: soft, non-tender; no masses,  no organomegaly Extremities: extremities normal, atraumatic, no cyanosis or edema Skin: skin color, texture, turgor normal. No rashes or lesions Lymph nodes: cervical, supraclavicular, and axillary nodes normal. Neurologic: grossly normal  Pelvic: External genitalia:  no lesions              No abnormal inguinal nodes palpated.              Urethra:  normal appearing urethra with no masses, tenderness or lesions              Bartholins and Skenes: normal                 Vagina: normal appearing vagina with normal color and discharge, no lesions              Cervix: no lesions              Pap taken: no Bimanual Exam:  Uterus:  normal size, contour, position, consistency, mobility, non-tender              Adnexa: no mass, fullness, tenderness              Rectal exam: yes.  Confirms.              Anus:  normal sphincter tone, no lesions  Chaperone was present for exam:  Estill Bamberg, CMA  Assessment:   Well woman visit with gynecologic exam. Cystitis. Irregular menses.   Plan: Mammogram screening discussed. Self breast awareness reviewed. Pap and HR HPV as above. Guidelines for Calcium, Vitamin D, regular exercise program including cardiovascular and weight bearing exercise. Urinalysis:  sg <= 1.005, ph 7.0, 40 - 60 WBC, 10 - 20 RBC, moderate bacteria. UC sent. Bactrim DS po bid x 3 days.  Ok to continue Pyridium for 2 more days prn.  Hydrate well.  Call if no improvement in 48 hours.  Check FSH and estradiol.  Follow up annually and prn.   After visit summary provided.  In addition to annual exam today, the patient was evaluated and treated for cystitis.

## 2021-09-12 ENCOUNTER — Other Ambulatory Visit: Payer: Self-pay

## 2021-09-12 ENCOUNTER — Ambulatory Visit (INDEPENDENT_AMBULATORY_CARE_PROVIDER_SITE_OTHER): Payer: BC Managed Care – PPO | Admitting: Obstetrics and Gynecology

## 2021-09-12 ENCOUNTER — Encounter: Payer: Self-pay | Admitting: Obstetrics and Gynecology

## 2021-09-12 VITALS — BP 138/80 | HR 67 | Ht 63.0 in | Wt 124.0 lb

## 2021-09-12 DIAGNOSIS — R5383 Other fatigue: Secondary | ICD-10-CM | POA: Diagnosis not present

## 2021-09-12 DIAGNOSIS — N926 Irregular menstruation, unspecified: Secondary | ICD-10-CM | POA: Diagnosis not present

## 2021-09-12 DIAGNOSIS — R61 Generalized hyperhidrosis: Secondary | ICD-10-CM | POA: Diagnosis not present

## 2021-09-12 DIAGNOSIS — R350 Frequency of micturition: Secondary | ICD-10-CM | POA: Diagnosis not present

## 2021-09-12 DIAGNOSIS — N3090 Cystitis, unspecified without hematuria: Secondary | ICD-10-CM

## 2021-09-12 DIAGNOSIS — N951 Menopausal and female climacteric states: Secondary | ICD-10-CM | POA: Diagnosis not present

## 2021-09-12 DIAGNOSIS — Z01419 Encounter for gynecological examination (general) (routine) without abnormal findings: Secondary | ICD-10-CM

## 2021-09-12 LAB — FOLLICLE STIMULATING HORMONE: FSH: 117.2 m[IU]/mL — ABNORMAL HIGH

## 2021-09-12 LAB — ESTRADIOL: Estradiol: <15 pg/mL

## 2021-09-12 MED ORDER — SULFAMETHOXAZOLE-TRIMETHOPRIM 800-160 MG PO TABS: 1.0000 | ORAL_TABLET | Freq: Two times a day (BID) | ORAL | 0 refills | Status: DC

## 2021-09-12 NOTE — Patient Instructions (Signed)

## 2021-09-15 LAB — URINALYSIS, COMPLETE W/RFL CULTURE
Bilirubin Urine: NEGATIVE
Casts: NONE SEEN /LPF
Crystals: NONE SEEN /HPF
Glucose, UA: NEGATIVE
Hyaline Cast: NONE SEEN /LPF
Ketones, ur: NEGATIVE
Nitrites, Initial: NEGATIVE
Specific Gravity, Urine: 1.003 (ref 1.001–1.035)
Yeast: NONE SEEN /HPF
pH: 7 (ref 5.0–8.0)

## 2021-09-15 LAB — URINE CULTURE
MICRO NUMBER:: 13042443
SPECIMEN QUALITY:: ADEQUATE

## 2021-09-15 LAB — CULTURE INDICATED

## 2021-09-19 DIAGNOSIS — M9903 Segmental and somatic dysfunction of lumbar region: Secondary | ICD-10-CM | POA: Diagnosis not present

## 2021-09-19 DIAGNOSIS — M5432 Sciatica, left side: Secondary | ICD-10-CM | POA: Diagnosis not present

## 2021-09-26 ENCOUNTER — Other Ambulatory Visit: Payer: Self-pay | Admitting: Obstetrics and Gynecology

## 2021-09-26 DIAGNOSIS — Z1231 Encounter for screening mammogram for malignant neoplasm of breast: Secondary | ICD-10-CM

## 2021-10-05 ENCOUNTER — Ambulatory Visit
Admission: RE | Admit: 2021-10-05 | Discharge: 2021-10-05 | Disposition: A | Discharge status: Home / Self Care | Payer: BC Managed Care – PPO | Source: Ambulatory Visit | Attending: Obstetrics and Gynecology | Admitting: Obstetrics and Gynecology

## 2021-10-05 DIAGNOSIS — Z1231 Encounter for screening mammogram for malignant neoplasm of breast: Secondary | ICD-10-CM

## 2022-01-21 DIAGNOSIS — H524 Presbyopia: Secondary | ICD-10-CM | POA: Diagnosis not present

## 2022-05-23 DIAGNOSIS — E785 Hyperlipidemia, unspecified: Secondary | ICD-10-CM | POA: Diagnosis not present

## 2022-05-23 DIAGNOSIS — D509 Iron deficiency anemia, unspecified: Secondary | ICD-10-CM | POA: Diagnosis not present

## 2022-05-27 DIAGNOSIS — Z1339 Encounter for screening examination for other mental health and behavioral disorders: Secondary | ICD-10-CM | POA: Diagnosis not present

## 2022-05-27 DIAGNOSIS — Z1331 Encounter for screening for depression: Secondary | ICD-10-CM | POA: Diagnosis not present

## 2022-05-27 DIAGNOSIS — Z0000 Encounter for general adult medical examination without abnormal findings: Secondary | ICD-10-CM | POA: Diagnosis not present

## 2022-05-27 DIAGNOSIS — D509 Iron deficiency anemia, unspecified: Secondary | ICD-10-CM | POA: Diagnosis not present

## 2022-05-27 DIAGNOSIS — R7989 Other specified abnormal findings of blood chemistry: Secondary | ICD-10-CM | POA: Diagnosis not present

## 2022-06-19 DIAGNOSIS — Z23 Encounter for immunization: Secondary | ICD-10-CM | POA: Diagnosis not present

## 2022-06-19 DIAGNOSIS — E785 Hyperlipidemia, unspecified: Secondary | ICD-10-CM | POA: Diagnosis not present

## 2022-07-16 IMAGING — MG DIGITAL SCREENING BILAT W/ CAD
4 series · 4 of 4 positions shown · non-contrast
Comparison: Previous exam(s).

CLINICAL DATA: Screening.

EXAM:
DIGITAL SCREENING BILATERAL MAMMOGRAM WITH CAD

[L MLO]
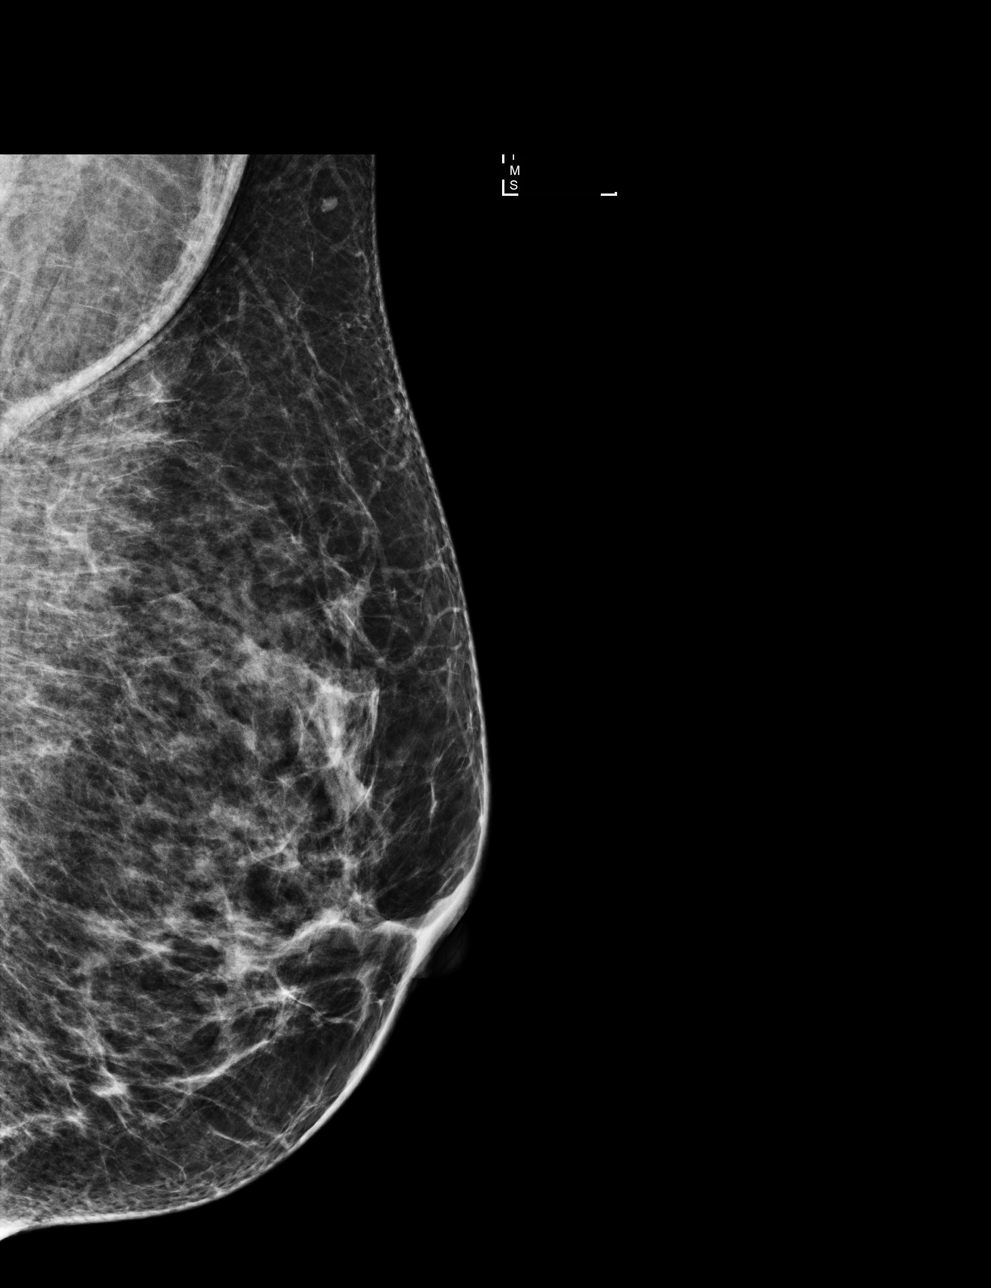

[R MLO]
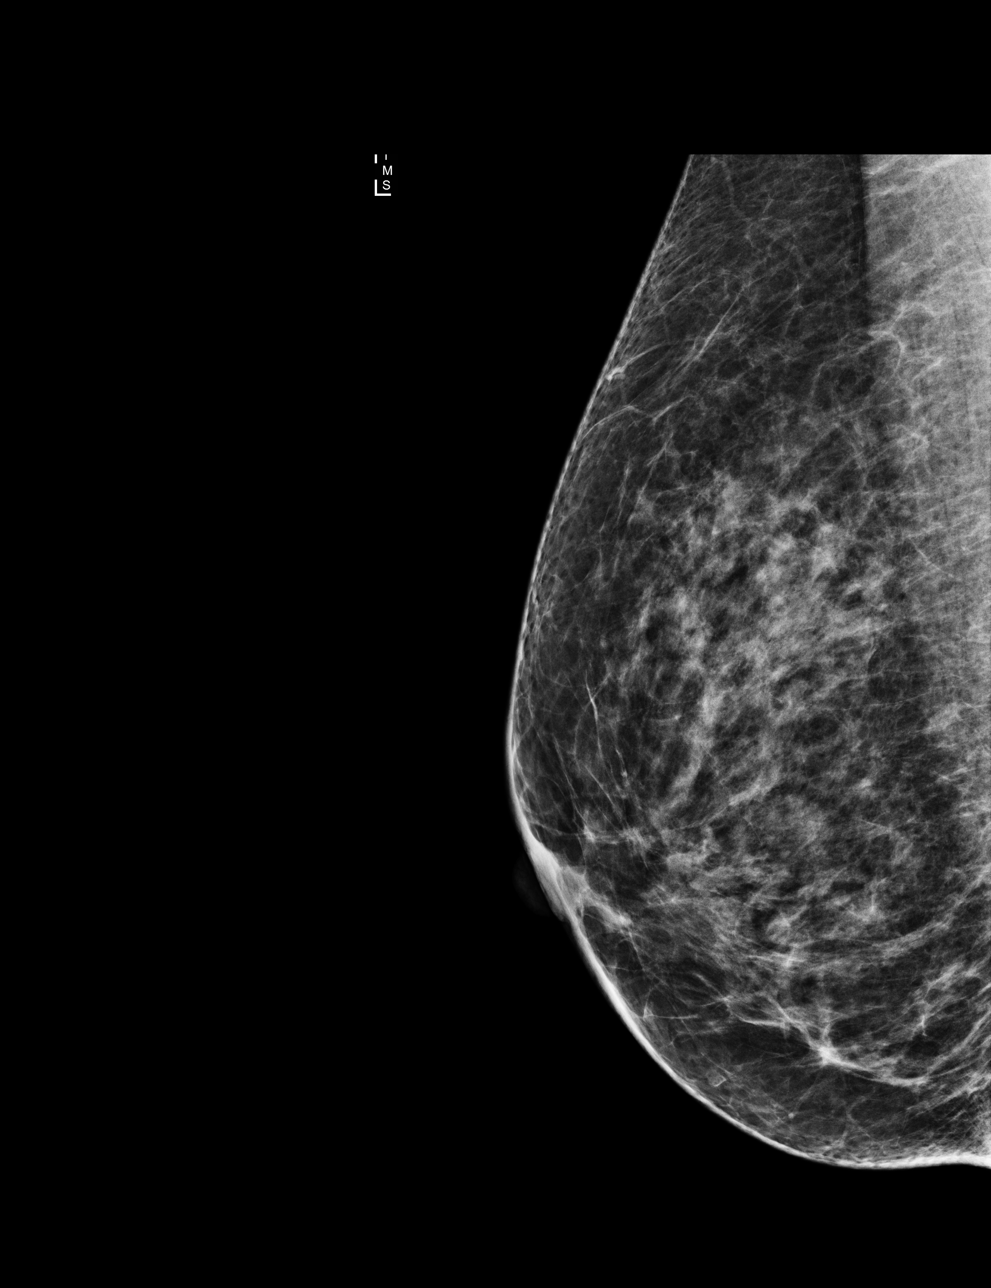

[L CC]
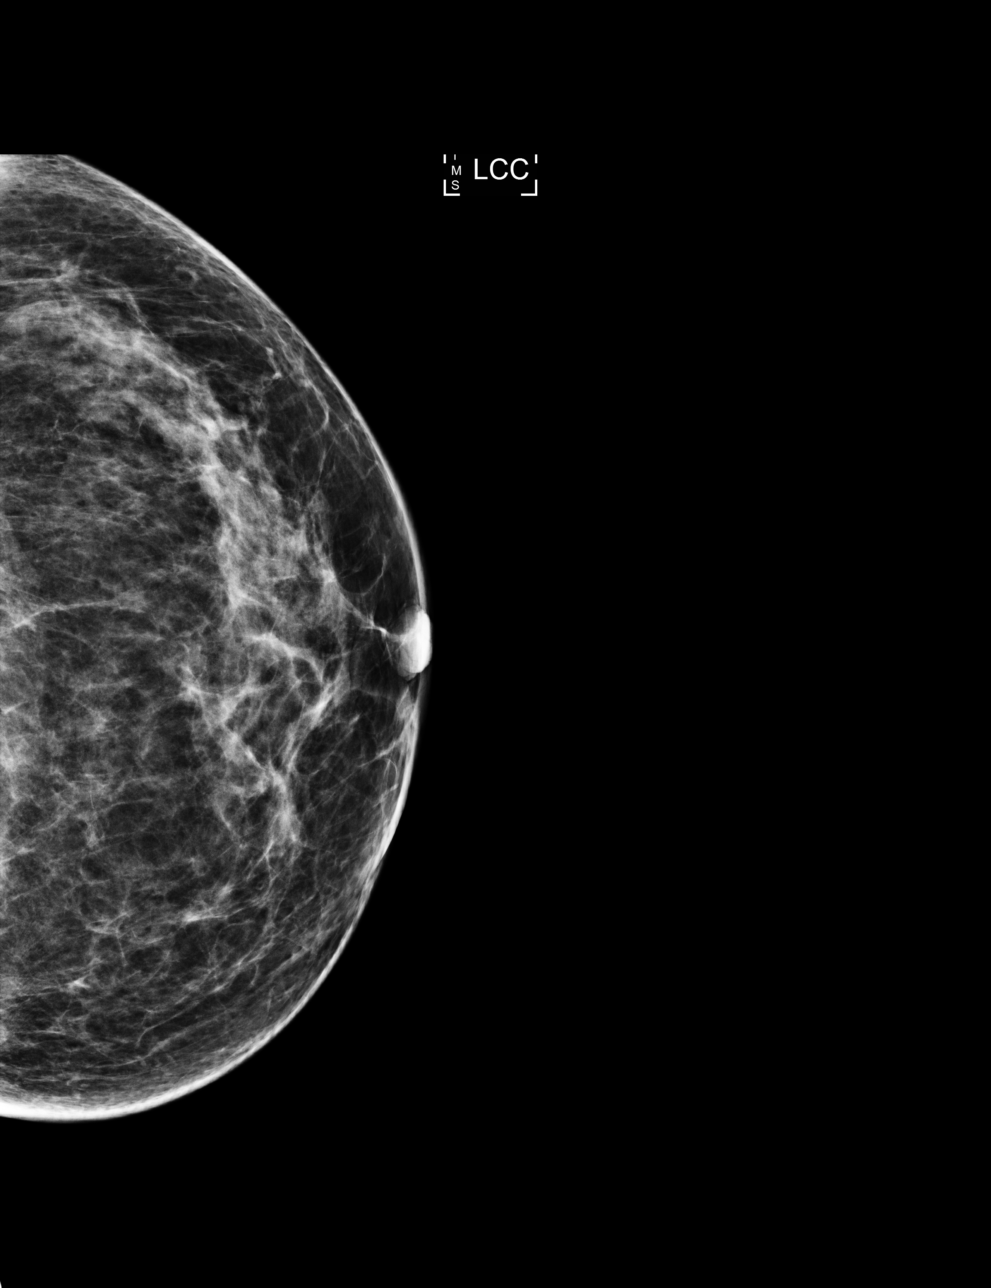

[R CC]
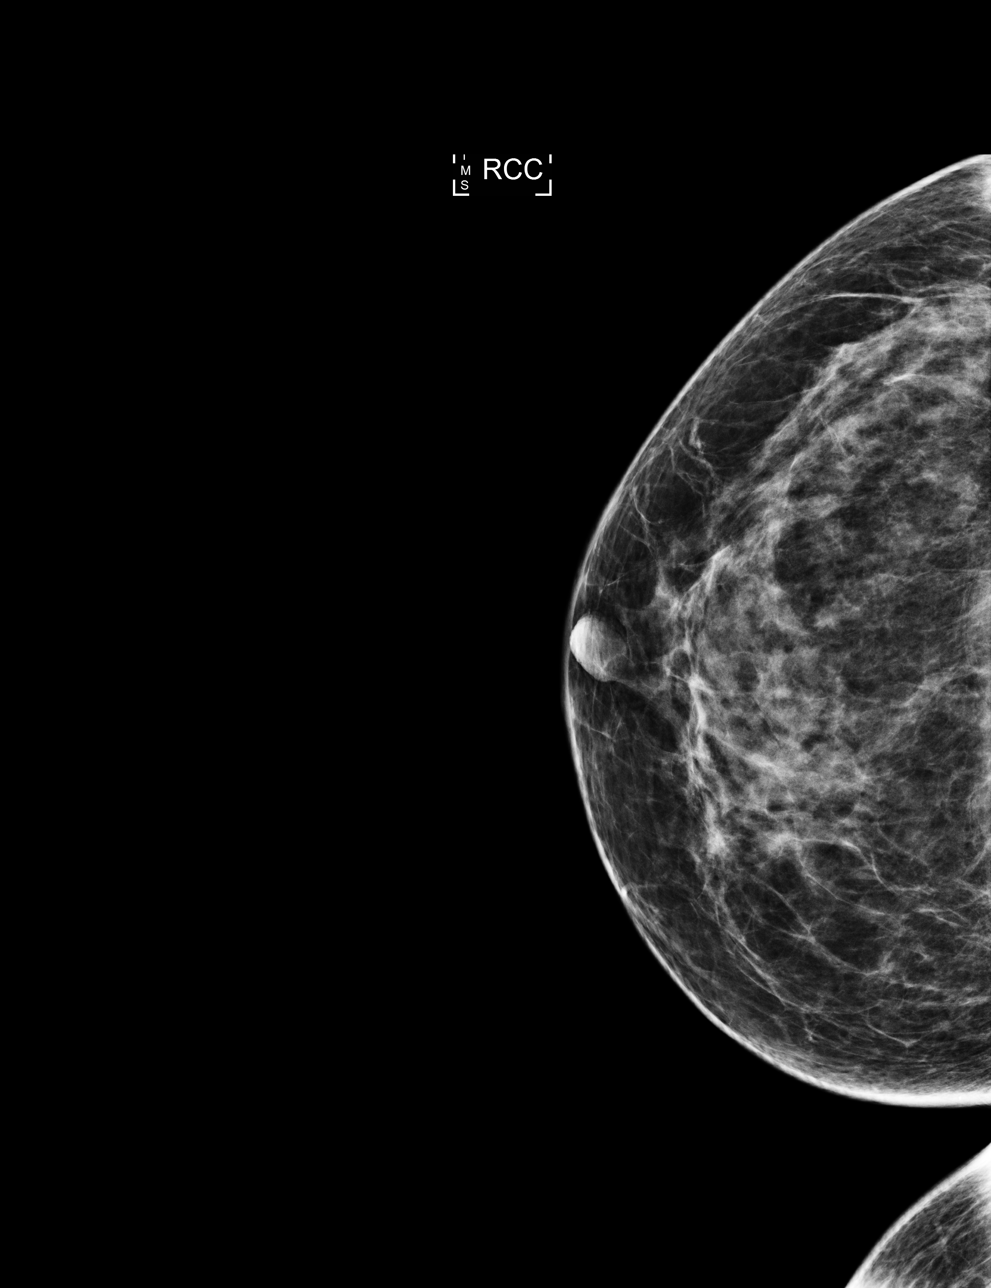

[4 of 4 positions shown; findings below may reference images not displayed]

ACR Breast Density Category c: The breast tissue is heterogeneously
dense, which may obscure small masses.
FINDINGS: There are no findings suspicious for malignancy. The images were
evaluated with computer-aided detection.
IMPRESSION: No mammographic evidence of malignancy. A result letter of this
screening mammogram will be mailed directly to the patient.

RECOMMENDATION:
Screening mammogram in one year. (Code:N3-Q-LRA)

BI-RADS CATEGORY  1: Negative.

## 2022-09-25 ENCOUNTER — Other Ambulatory Visit: Payer: Self-pay | Admitting: Obstetrics and Gynecology

## 2022-09-25 DIAGNOSIS — Z1231 Encounter for screening mammogram for malignant neoplasm of breast: Secondary | ICD-10-CM

## 2022-10-10 NOTE — Progress Notes (Deleted)
57 y.o. G2P2 Married Caucasian female here for annual exam.    PCP:     Patient's last menstrual period was 05/30/2021 (exact date).           Sexually active: {yes no:314532}  The current method of family planning is post menopausal status.    Exercising: {yes no:314532}  {types:19826} Smoker:  no  Health Maintenance: Pap:    08-20-19 Neg:Neg HR HPV, 08-04-15 Neg:Neg HR HPV, 08-16-11 Neg:Neg HR HPV History of abnormal Pap:  no MMG:  10/05/21 Breast Density Category C, BI-RADS CATEGORY 1 neg Colonoscopy:    04/09/16 normal;next 10 years  BMD:   n/a  Result  n/a TDaP:  05/11/21 Gardasil:   no HIV: neg in preg Hep C: no Screening Labs:  Hb today: ***, Urine today: ***   reports that she has never smoked. She has never used smokeless tobacco. She reports current alcohol use of about 3.0 standard drinks of alcohol per week. She reports that she does not use drugs.  Past Medical History:  Diagnosis Date   Anxiety    Endometrial polyp    History of 2019 novel coronavirus disease (COVID-19) 05/2020   per pt had mild symptoms that resolved   IDA (iron deficiency anemia)    09-20-2020  per pt Hg as been normal in past few yrs, but has been on oral medication and iv rron infusions   Menorrhagia with irregular cycle    Migraine    with aura   PMS (premenstrual syndrome)    Seasonal allergies    Wears glasses     Past Surgical History:  Procedure Laterality Date   COLONOSCOPY WITH PROPOFOL  approx. 2018   DILATATION & CURETTAGE/HYSTEROSCOPY WITH MYOSURE N/A 09/26/2020   Procedure: DILATATION & CURETTAGE/HYSTEROSCOPY WITH MYOSURE/resection of endometrial poly[;  Surgeon: Nunzio Cobbs, MD;  Location: [facility];  Service: Gynecology;  Laterality: N/A;   WISDOM TOOTH EXTRACTION Bilateral age 27    Current Outpatient Medications  Medication Sig Dispense Refill   ALPRAZolam (XANAX) 0.5 MG tablet 1 pill every 6 hours as needed for anxiety     Calcium  Carbonate-Vitamin D (CALCIUM + D PO) Take 1 tablet by mouth daily.     escitalopram (LEXAPRO) 10 MG tablet Take 1 tablet by mouth daily.     Multiple Vitamin (MULTIVITAMIN) capsule Take 1 capsule by mouth daily.     Multiple Vitamins-Minerals (VITAMIN D3 COMPLETE PO) Take 1,000 Units by mouth daily.     sulfamethoxazole-trimethoprim (BACTRIM DS) 800-160 MG tablet Take 1 tablet by mouth 2 (two) times daily. One PO BID x 3 days 6 tablet 0   No current facility-administered medications for this visit.    Family History  Problem Relation Age of Onset   Hyperlipidemia Mother    Hypertension Mother    Cancer - Lung Mother 75       stage IV non small cell, chemo starting 5/14   COPD Father    Colon polyps Father    Colon cancer Neg Hx    Rectal cancer Neg Hx    Stomach cancer Neg Hx     Review of Systems  Exam:   LMP 05/30/2021 (Exact Date)     General appearance: alert, cooperative and appears stated age Head: normocephalic, without obvious abnormality, atraumatic Neck: no adenopathy, supple, symmetrical, trachea midline and thyroid normal to inspection and palpation Lungs: clear to auscultation bilaterally Breasts: normal appearance, no masses or tenderness, No nipple retraction or  dimpling, No nipple discharge or bleeding, No axillary adenopathy Heart: regular rate and rhythm Abdomen: soft, non-tender; no masses, no organomegaly Extremities: extremities normal, atraumatic, no cyanosis or edema Skin: skin color, texture, turgor normal. No rashes or lesions Lymph nodes: cervical, supraclavicular, and axillary nodes normal. Neurologic: grossly normal  Pelvic: External genitalia:  no lesions              No abnormal inguinal nodes palpated.              Urethra:  normal appearing urethra with no masses, tenderness or lesions              Bartholins and Skenes: normal                 Vagina: normal appearing vagina with normal color and discharge, no lesions              Cervix: no  lesions              Pap taken: {yes no:314532} Bimanual Exam:  Uterus:  normal size, contour, position, consistency, mobility, non-tender              Adnexa: no mass, fullness, tenderness              Rectal exam: {yes no:314532}.  Confirms.              Anus:  normal sphincter tone, no lesions  Chaperone was present for exam:  ***  Assessment:   Well woman visit with gynecologic exam.   Plan: Mammogram screening discussed. Self breast awareness reviewed. Pap and HR HPV as above. Guidelines for Calcium, Vitamin D, regular exercise program including cardiovascular and weight bearing exercise.   Follow up annually and prn.   Additional counseling given.  {yes [id number]. _______ minutes face to face time of which over 50% was spent in counseling.    After visit summary provided.

## 2022-10-23 ENCOUNTER — Ambulatory Visit: Payer: BC Managed Care – PPO | Admitting: Obstetrics and Gynecology

## 2022-10-24 NOTE — Progress Notes (Deleted)
57 y.o. G2P2 Married Caucasian female here for annual exam.    PCP:     Patient's last menstrual period was 05/30/2021 (exact date).           Sexually active: {yes no:314532}  The current method of family planning is post menopausal status.    Exercising: {yes no:314532}  {types:19826} Smoker:  no  Health Maintenance: Pap:  08/20/19 neg: HR HPV neg, 08/04/15 neg History of abnormal Pap:  no MMG:  10/05/21 Breast Density Cat C, BI-RADS CAT 1 neg Colonoscopy:  04/09/16 BMD:   n/a  Result  n/a TDaP:  05/11/21 Gardasil:   no HIV: neg in preg Hep C: no Screening Labs:  Hb today: ***, Urine today: ***   reports that she has never smoked. She has never used smokeless tobacco. She reports current alcohol use of about 3.0 standard drinks of alcohol per week. She reports that she does not use drugs.  Past Medical History:  Diagnosis Date   Anxiety    Endometrial polyp    History of 2019 novel coronavirus disease (COVID-19) 05/2020   per pt had mild symptoms that resolved   IDA (iron deficiency anemia)    09-20-2020  per pt Hg as been normal in past few yrs, but has been on oral medication and iv rron infusions   Menorrhagia with irregular cycle    Migraine    with aura   PMS (premenstrual syndrome)    Seasonal allergies    Wears glasses     Past Surgical History:  Procedure Laterality Date   COLONOSCOPY WITH PROPOFOL  approx. 2018   DILATATION & CURETTAGE/HYSTEROSCOPY WITH MYOSURE N/A 09/26/2020   Procedure: DILATATION & CURETTAGE/HYSTEROSCOPY WITH MYOSURE/resection of endometrial poly[;  Surgeon: Nunzio Cobbs, MD;  Location: [facility];  Service: Gynecology;  Laterality: N/A;   WISDOM TOOTH EXTRACTION Bilateral age 35    Current Outpatient Medications  Medication Sig Dispense Refill   ALPRAZolam (XANAX) 0.5 MG tablet 1 pill every 6 hours as needed for anxiety     Calcium Carbonate-Vitamin D (CALCIUM + D PO) Take 1 tablet by mouth daily.      escitalopram (LEXAPRO) 10 MG tablet Take 1 tablet by mouth daily.     Multiple Vitamin (MULTIVITAMIN) capsule Take 1 capsule by mouth daily.     Multiple Vitamins-Minerals (VITAMIN D3 COMPLETE PO) Take 1,000 Units by mouth daily.     sulfamethoxazole-trimethoprim (BACTRIM DS) 800-160 MG tablet Take 1 tablet by mouth 2 (two) times daily. One PO BID x 3 days 6 tablet 0   No current facility-administered medications for this visit.    Family History  Problem Relation Age of Onset   Hyperlipidemia Mother    Hypertension Mother    Cancer - Lung Mother 11       stage IV non small cell, chemo starting 5/14   COPD Father    Colon polyps Father    Colon cancer Neg Hx    Rectal cancer Neg Hx    Stomach cancer Neg Hx     Review of Systems  Exam:   LMP 05/30/2021 (Exact Date)     General appearance: alert, cooperative and appears stated age Head: normocephalic, without obvious abnormality, atraumatic Neck: no adenopathy, supple, symmetrical, trachea midline and thyroid normal to inspection and palpation Lungs: clear to auscultation bilaterally Breasts: normal appearance, no masses or tenderness, No nipple retraction or dimpling, No nipple discharge or bleeding, No axillary adenopathy Heart: regular rate and  rhythm Abdomen: soft, non-tender; no masses, no organomegaly Extremities: extremities normal, atraumatic, no cyanosis or edema Skin: skin color, texture, turgor normal. No rashes or lesions Lymph nodes: cervical, supraclavicular, and axillary nodes normal. Neurologic: grossly normal  Pelvic: External genitalia:  no lesions              No abnormal inguinal nodes palpated.              Urethra:  normal appearing urethra with no masses, tenderness or lesions              Bartholins and Skenes: normal                 Vagina: normal appearing vagina with normal color and discharge, no lesions              Cervix: no lesions              Pap taken: {yes no:314532} Bimanual Exam:   Uterus:  normal size, contour, position, consistency, mobility, non-tender              Adnexa: no mass, fullness, tenderness              Rectal exam: {yes no:314532}.  Confirms.              Anus:  normal sphincter tone, no lesions  Chaperone was present for exam:  ***  Assessment:   Well woman visit with gynecologic exam.   Plan: Mammogram screening discussed. Self breast awareness reviewed. Pap and HR HPV as above. Guidelines for Calcium, Vitamin D, regular exercise program including cardiovascular and weight bearing exercise.   Follow up annually and prn.   Additional counseling given.  {yes [id number]. _______ minutes face to face time of which over 50% was spent in counseling.    After visit summary provided.

## 2022-10-29 ENCOUNTER — Ambulatory Visit: Payer: Self-pay | Admitting: Obstetrics and Gynecology

## 2022-11-13 ENCOUNTER — Ambulatory Visit
Admission: RE | Admit: 2022-11-13 | Discharge: 2022-11-13 | Disposition: A | Discharge status: Home / Self Care | Payer: Self-pay | Source: Ambulatory Visit | Attending: Obstetrics and Gynecology | Admitting: Obstetrics and Gynecology

## 2022-11-13 DIAGNOSIS — Z1231 Encounter for screening mammogram for malignant neoplasm of breast: Secondary | ICD-10-CM

## 2022-11-25 DIAGNOSIS — J0100 Acute maxillary sinusitis, unspecified: Secondary | ICD-10-CM | POA: Diagnosis not present

## 2022-11-25 DIAGNOSIS — J029 Acute pharyngitis, unspecified: Secondary | ICD-10-CM | POA: Diagnosis not present

## 2022-12-05 ENCOUNTER — Ambulatory Visit: Payer: Self-pay | Admitting: Obstetrics and Gynecology

## 2022-12-17 NOTE — Progress Notes (Signed)
57 y.o. G2P2 Married Caucasian female here for annual exam.  Pt notices some spotting after intercourse, no pain.  The bleeding occurs with intercourse consistently but stops quickly.  No other bleeding at any other times.   Some dryness with intercourse.   Having hot flashes that come and go.  Not pursuing tx.  PCP:   Dr. Clelia Croft  Patient's last menstrual period was 05/30/2021 (exact date).           Sexually active: Yes.    The current method of family planning is post menopausal status/vasectomy.    Exercising: Yes.     Walking, hiking Smoker:  no  Health Maintenance: Pap:  08/20/19 neg: HR HPV neg History of abnormal Pap:  no MMG:  11/13/22 Breast Density Cat C, BI-RADS CAT 1 neg Colonoscopy:  04/09/16 BMD:   n/a Result  n/a TDaP:  05/11/21 Gardasil:   no HIV: donated blood Hep C: n/a Screening Labs: PCP   reports that she has never smoked. She has never used smokeless tobacco. She reports current alcohol use of about 3.0 standard drinks of alcohol per week. She reports that she does not use drugs.  Past Medical History:  Diagnosis Date   Anxiety    Endometrial polyp    History of 2019 novel coronavirus disease (COVID-19) 05/2020   per pt had mild symptoms that resolved   IDA (iron deficiency anemia)    09-20-2020  per pt Hg as been normal in past few yrs, but has been on oral medication and iv rron infusions   Menorrhagia with irregular cycle    Migraine    with aura   PMS (premenstrual syndrome)    Seasonal allergies    Wears glasses     Past Surgical History:  Procedure Laterality Date   COLONOSCOPY WITH PROPOFOL  approx. 2018   DILATATION & CURETTAGE/HYSTEROSCOPY WITH MYOSURE N/A 09/26/2020   Procedure: DILATATION & CURETTAGE/HYSTEROSCOPY WITH MYOSURE/resection of endometrial poly[;  Surgeon: Patton Salles, MD;  Location: [facility];  Service: Gynecology;  Laterality: N/A;   WISDOM TOOTH EXTRACTION Bilateral age 54    Current  Outpatient Medications  Medication Sig Dispense Refill   ALPRAZolam (XANAX) 0.5 MG tablet 1 pill every 6 hours as needed for anxiety     Calcium Carbonate-Vitamin D (CALCIUM + D PO) Take 1 tablet by mouth daily.     escitalopram (LEXAPRO) 10 MG tablet Take 1 tablet by mouth daily.     Multiple Vitamin (MULTIVITAMIN) capsule Take 1 capsule by mouth daily.     Multiple Vitamins-Minerals (VITAMIN D3 COMPLETE PO) Take 1,000 Units by mouth daily.     No current facility-administered medications for this visit.    Family History  Problem Relation Age of Onset   Hyperlipidemia Mother    Hypertension Mother    Cancer - Lung Mother 8       stage IV non small cell, chemo starting 5/14   COPD Father    Colon polyps Father    Colon cancer Neg Hx    Rectal cancer Neg Hx    Stomach cancer Neg Hx     Review of Systems  All other systems reviewed and are negative.   Exam:   BP 120/82 (BP Location: Left Arm, Patient Position: Sitting, Cuff Size: Normal)   Pulse 65   Ht 5\' 2"  (1.575 m)   Wt 132 lb (59.9 kg)   LMP 05/30/2021 (Exact Date)   SpO2 100%   BMI  24.14 kg/m     General appearance: alert, cooperative and appears stated age Head: normocephalic, without obvious abnormality, atraumatic Neck: no adenopathy, supple, symmetrical, trachea midline and thyroid normal to inspection and palpation Lungs: clear to auscultation bilaterally Breasts: normal appearance, no masses or tenderness, No nipple retraction or dimpling, No nipple discharge or bleeding, No axillary adenopathy Heart: regular rate and rhythm Abdomen: soft, non-tender; no masses, no organomegaly Extremities: extremities normal, atraumatic, no cyanosis or edema Skin: skin color, texture, turgor normal. No rashes or lesions Lymph nodes: cervical, supraclavicular, and axillary nodes normal. Neurologic: grossly normal  Pelvic: External genitalia:  no lesions              No abnormal inguinal nodes palpated.               Urethra:  normal appearing urethra with no masses, tenderness or lesions              Bartholins and Skenes: normal                 Vagina: normal appearing vagina with normal color and discharge, atrophy changes noted with exam.               Cervix: no lesions              Pap taken: yes Bimanual Exam:  Uterus:  normal size, contour, position, consistency, mobility, non-tender              Adnexa: no mass, fullness, tenderness              Rectal exam: yes.  Confirms.              Anus:  normal sphincter tone, no lesions  Chaperone was present for exam:  Senaida Ores, CMA  Assessment:   Well woman visit with gynecologic exam. Menopausal symptoms.  Post coital spotting.  Vaginal atrophy. Hx hysteroscopy with dilation and curettage.   Plan: Mammogram screening discussed. Self breast awareness reviewed. Pap and HR HPV collected.  Guidelines for Calcium, Vitamin D, regular exercise program including cardiovascular and weight bearing exercise. Patient declines HRT after we discussed potential risk of stroke, MI, DVT, PE, and breast cancer.  We discussed tx options for vaginal atrophy.   Patient opts for Vagifem.  Instructed in use and I discussed potential effect on breast cancer.   Follow up annually and prn.

## 2022-12-30 ENCOUNTER — Ambulatory Visit (INDEPENDENT_AMBULATORY_CARE_PROVIDER_SITE_OTHER): Payer: BC Managed Care – PPO | Admitting: Obstetrics and Gynecology

## 2022-12-30 ENCOUNTER — Other Ambulatory Visit (HOSPITAL_COMMUNITY)
Admission: RE | Admit: 2022-12-30 | Discharge: 2022-12-30 | Disposition: A | Discharge status: Home / Self Care | Payer: BC Managed Care – PPO | Source: Ambulatory Visit | Attending: Obstetrics and Gynecology | Admitting: Obstetrics and Gynecology

## 2022-12-30 ENCOUNTER — Encounter: Payer: Self-pay | Admitting: Obstetrics and Gynecology

## 2022-12-30 VITALS — BP 120/82 | HR 65 | Ht 62.0 in | Wt 132.0 lb

## 2022-12-30 DIAGNOSIS — N951 Menopausal and female climacteric states: Secondary | ICD-10-CM

## 2022-12-30 DIAGNOSIS — N930 Postcoital and contact bleeding: Secondary | ICD-10-CM | POA: Insufficient documentation

## 2022-12-30 DIAGNOSIS — Z01419 Encounter for gynecological examination (general) (routine) without abnormal findings: Secondary | ICD-10-CM

## 2022-12-30 DIAGNOSIS — Z124 Encounter for screening for malignant neoplasm of cervix: Secondary | ICD-10-CM | POA: Insufficient documentation

## 2022-12-30 DIAGNOSIS — N952 Postmenopausal atrophic vaginitis: Secondary | ICD-10-CM | POA: Diagnosis not present

## 2022-12-30 MED ORDER — ESTRADIOL 10 MCG VA TABS: 1.0000 | ORAL_TABLET | VAGINAL | 3 refills | Status: DC

## 2022-12-30 NOTE — Patient Instructions (Signed)

## 2023-01-02 LAB — CYTOLOGY - PAP
Comment: NEGATIVE
Diagnosis: NEGATIVE
High risk HPV: NEGATIVE

## 2023-01-06 ENCOUNTER — Ambulatory Visit: Payer: Self-pay | Admitting: Obstetrics and Gynecology

## 2023-06-11 DIAGNOSIS — E785 Hyperlipidemia, unspecified: Secondary | ICD-10-CM | POA: Diagnosis not present

## 2023-06-11 DIAGNOSIS — D509 Iron deficiency anemia, unspecified: Secondary | ICD-10-CM | POA: Diagnosis not present

## 2023-06-18 DIAGNOSIS — Z0000 Encounter for general adult medical examination without abnormal findings: Secondary | ICD-10-CM | POA: Diagnosis not present

## 2023-06-18 DIAGNOSIS — Z1339 Encounter for screening examination for other mental health and behavioral disorders: Secondary | ICD-10-CM | POA: Diagnosis not present

## 2023-06-18 DIAGNOSIS — R82998 Other abnormal findings in urine: Secondary | ICD-10-CM | POA: Diagnosis not present

## 2023-06-18 DIAGNOSIS — M5432 Sciatica, left side: Secondary | ICD-10-CM | POA: Diagnosis not present

## 2023-06-18 DIAGNOSIS — Z1331 Encounter for screening for depression: Secondary | ICD-10-CM | POA: Diagnosis not present

## 2023-12-22 ENCOUNTER — Other Ambulatory Visit: Payer: Self-pay | Admitting: Obstetrics and Gynecology

## 2023-12-22 DIAGNOSIS — Z0000 Encounter for general adult medical examination without abnormal findings: Secondary | ICD-10-CM

## 2023-12-24 ENCOUNTER — Ambulatory Visit
Admission: RE | Admit: 2023-12-24 | Discharge: 2023-12-24 | Disposition: A | Discharge status: Home / Self Care | Payer: PRIVATE HEALTH INSURANCE | Source: Ambulatory Visit | Attending: Obstetrics and Gynecology | Admitting: Obstetrics and Gynecology

## 2023-12-24 DIAGNOSIS — Z0000 Encounter for general adult medical examination without abnormal findings: Secondary | ICD-10-CM

## 2023-12-24 DIAGNOSIS — Z1231 Encounter for screening mammogram for malignant neoplasm of breast: Secondary | ICD-10-CM | POA: Diagnosis not present

## 2023-12-30 ENCOUNTER — Ambulatory Visit: Payer: Self-pay | Admitting: Obstetrics and Gynecology

## 2024-01-21 ENCOUNTER — Ambulatory Visit: Payer: Self-pay | Admitting: Podiatry

## 2024-01-21 ENCOUNTER — Encounter: Payer: Self-pay | Admitting: Podiatry

## 2024-01-21 DIAGNOSIS — B351 Tinea unguium: Secondary | ICD-10-CM | POA: Diagnosis not present

## 2024-01-21 DIAGNOSIS — M79671 Pain in right foot: Secondary | ICD-10-CM

## 2024-01-21 MED ORDER — EFINACONAZOLE 10 % EX SOLN: 1.0000 [drp] | Freq: Every day | CUTANEOUS | 6 refills | Status: AC

## 2024-01-21 NOTE — Progress Notes (Signed)
 Presents complaining of tenderness around the hallux nail right with splitting of the nail plate.  Has started noticing distal discoloration and crumbling plate.  Has some yellowing of the distal portion of the nail.  Becoming loose at the nailbed.  Does not recall any injury to the toe.   Physical exam:  General appearance: Pleasant, and in no acute distress. AOx3.  Vascular: Pedal pulses: DP 2/4 bilaterally, PT 2 to/4 bilaterally.  Mild edema lower legs bilaterally. Capillary fill time immediate bilaterally.  Neurological: Light touch intact feet bilaterally.  Normal Achilles reflex bilaterally.  No clonus or spasticity noted.   Dermatologic:   Longitudinal split in the fifth nail right with onychomycotic changes with subungual debris discoloration along the fissure and at the distal portion of the nail plate.  Skin normal temperature bilaterally.  Skin normal color, tone, and texture bilaterally.   Musculoskeletal: Hallux abductovalgus bilaterally.  Tenderness distal hallux right    Diagnosis: 1.  Onychomycosis hallux nail right 2.  Pain hallux right  Plan: -New patient office visit level 3 for evaluation and management. - Discussed with her the splitting of the hallux nail.  Appears that she has got onychomycosis started along the fissure and at the very distal portion of the nail plate.  Will try using topical Jublia to try to clear this.  Also recommended p.o. Biotin OTC -Rx Jublia apply daily to affected nail  Return 5 months follow-up hallux nail right

## 2024-01-26 ENCOUNTER — Telehealth: Payer: Self-pay

## 2024-01-26 NOTE — Telephone Encounter (Signed)
 Unable to submit PA through CoverMyMeds had to call 3rd party pharmacy benefit provider to obatin PA form.

## 2024-01-26 NOTE — Telephone Encounter (Signed)
 PT called regarding needing a PA. I read her your message.  She also says she has this card and not sure it will help you or not:  [employer] card: RX GRP # [id number] ID# [id number] PH# [phone]

## 2024-01-26 NOTE — Telephone Encounter (Signed)
 PA request received from [facility] for Jublia  10% solution. PA submitted through CoverMyMeds and waiting on response.  Jacolyn Pepper  (Key: [id number])

## 2024-01-26 NOTE — Telephone Encounter (Signed)
 PA form received. Form completed and given to Dr. Geraline LATHER to sign so it can be faxed back.

## 2024-01-28 NOTE — Telephone Encounter (Signed)
 Jublia  PA was denied for the following reasons

## 2024-01-28 NOTE — Telephone Encounter (Signed)
 PA form was faxed to [employer] and waiting on response

## 2024-01-29 ENCOUNTER — Other Ambulatory Visit: Payer: Self-pay

## 2024-01-29 ENCOUNTER — Telehealth: Payer: Self-pay | Admitting: Podiatry

## 2024-01-29 NOTE — Progress Notes (Signed)
 error

## 2024-01-29 NOTE — Telephone Encounter (Signed)
 Patient called in regards to prescription for kerydin. Needing it sent to the [facility] [place] in chart. Thanks

## 2024-01-30 ENCOUNTER — Other Ambulatory Visit: Payer: Self-pay | Admitting: Lab

## 2024-01-30 ENCOUNTER — Other Ambulatory Visit: Payer: Self-pay

## 2024-01-30 DIAGNOSIS — B351 Tinea unguium: Secondary | ICD-10-CM

## 2024-01-30 MED ORDER — TAVABOROLE 5 % EX SOLN: 1.0000 | Freq: Every day | CUTANEOUS | 2 refills | Status: DC

## 2024-01-30 NOTE — Telephone Encounter (Signed)
 error

## 2024-04-28 ENCOUNTER — Ambulatory Visit: Payer: PRIVATE HEALTH INSURANCE | Admitting: Obstetrics and Gynecology

## 2024-04-28 ENCOUNTER — Encounter: Payer: Self-pay | Admitting: Obstetrics and Gynecology

## 2024-04-28 VITALS — BP 122/76 | HR 71 | Ht 62.5 in | Wt 131.0 lb

## 2024-04-28 DIAGNOSIS — Z1331 Encounter for screening for depression: Secondary | ICD-10-CM

## 2024-04-28 DIAGNOSIS — Z01419 Encounter for gynecological examination (general) (routine) without abnormal findings: Secondary | ICD-10-CM

## 2024-04-28 DIAGNOSIS — N952 Postmenopausal atrophic vaginitis: Secondary | ICD-10-CM | POA: Diagnosis not present

## 2024-04-28 MED ORDER — ESTRADIOL 0.1 MG/GM VA CREA: TOPICAL_CREAM | VAGINAL | 2 refills | Status: AC

## 2024-04-28 NOTE — Patient Instructions (Addendum)
 Vaginal Dryness and Thinning (Atrophic Vaginitis): What to Know  Atrophic vaginitis is a condition where the lining of the vagina becomes thin, dry, and inflamed. This condition can make you more likely to: Get an infection. Have pain during sex. Have other uncomfortable symptoms. Tell your health care provider if you notice any changes in your vagina. They can help you find ways to feel better. They can also treat infections and suggest healthy habits for a healthy vagina. What are the causes? Atrophic vaginitis is caused by a drop in estrogen. It's more common when menstrual periods stop during menopause. This often starts between ages 34-55 and may get worse over time as estrogen levels get lower. Estrogen helps to: Keep the vagina moist. Lubricate the vagina during sex. Protect against infection. A drop in estrogen can lead to: Thinner and drier vaginal lining. A smaller, less stretchy vagina. What increases the risk? Many factors make you more likely to have vaginal dryness and thinning. These include: Taking medicines that block estrogen. Having your ovaries taken out. Cancer treatment, such as: Radiation. Chemotherapy. Having recently delivered a baby, especially if not a vaginal delivery. Breastfeeding. Being over age 41. Having an eating disorder. Smoking. What are the signs or symptoms? Pain, soreness, or bleeding during sex. Burning, irritation, or itching around your vagina. Loss of interest in sex. Burning pain while peeing or peeing often. Vaginal discharge. It may be: White. Martina Sledge. Yellow. Blood-tinged. Thick. Watery. Sometimes, there are no symptoms. How is this diagnosed? This condition is diagnosed based on: Your medical history. A pelvic exam to check the tissue in the vagina. Rarely, you may have more tests. These include: A pee test to check for a urinary tract infection. An acid balance check in the vagina. How is this treated? Treatment depends  on your symptoms. Treatment may include: Lubricants for the vagina. Long-acting moisturizers. Low-dose estrogen. These come in: Creams. Tablets. Vaginal rings. Treatment may not be needed if your symptoms are mild and you're not having sex. Talk to your provider about any history of breast cancer, endometrial cancer, or blood clots. Follow these instructions at home: Medicines Take your medicines only as told. Do not use herbal or other medicines unless your provider says it's safe. Use creams, lubricants, or moisturizers only as told. General instructions If your dryness and thinning is related to menopause, talk about your symptoms and treatment options with your provider. Do not douche. Do not use products that make your vagina dry. These include: Scented sprays. Tampons. Soaps. Use water-soluble lubricant or moisturizer if sex is painful. Having sex more often can improve blood flow and make the vaginal tissue more stretchy. Contact a health care provider if: Your discharge from your vagina changes in color and has a smell. You have new symptoms. Your symptoms don't get better with treatment. Your symptoms get worse. This information is not intended to replace advice given to you by your health care provider. Make sure you discuss any questions you have with your health care provider. Document Revised: 02/17/2023 Document Reviewed: 02/17/2023 Elsevier Patient Education  2024 Else[employer] Inc.  EXERCISE AND DIET:  We recommended that you start or continue a regular exercise program for good health. Regular exercise means any activity that makes your heart beat faster and makes you sweat.  We recommend exercising at least 30 minutes per day at least 3 days a week, preferably 4 or 5.  We also recommend a diet low in fat and sugar.  Inactivity, poor dietary choices  and obesity can cause diabetes, heart attack, stroke, and kidney damage, among others.    ALCOHOL AND SMOKING:  Women  should limit their alcohol intake to no more than 7 drinks/beers/glasses of wine (combined, not each!) per week. Moderation of alcohol intake to this level decreases your risk of breast cancer and liver damage. And of course, no recreational drugs are part of a healthy lifestyle.  And absolutely no smoking or even second hand smoke. Most people know smoking can cause heart and lung diseases, but did you know it also contributes to weakening of your bones? Aging of your skin?  Yellowing of your teeth and nails?  CALCIUM AND VITAMIN D:  Adequate intake of calcium and Vitamin D are recommended.  The recommendations for exact amounts of these supplements seem to change often, but generally speaking 600 mg of calcium (either carbonate or citrate) and 800 units of Vitamin D per day seems prudent. Certain women may benefit from higher intake of Vitamin D.  If you are among these women, your doctor will have told you during your visit.    PAP SMEARS:  Pap smears, to check for cervical cancer or precancers,  have traditionally been done yearly, although recent scientific advances have shown that most women can have pap smears less often.  However, every woman still should have a physical exam from her gynecologist every year. It will include a breast check, inspection of the vulva and vagina to check for abnormal growths or skin changes, a visual exam of the cervix, and then an exam to evaluate the size and shape of the uterus and ovaries.  And after 58 years of age, a rectal exam is indicated to check for rectal cancers. We will also provide age appropriate advice regarding health maintenance, like when you should have certain vaccines, screening for sexually transmitted diseases, bone density testing, colonoscopy, mammograms, etc.   MAMMOGRAMS:  All women over 68 years old should have a yearly mammogram. Many facilities now offer a "3D" mammogram, which may cost around $50 extra out of pocket. If possible,  we  recommend you accept the option to have the 3D mammogram performed.  It both reduces the number of women who will be called back for extra views which then turn out to be normal, and it is better than the routine mammogram at detecting truly abnormal areas.    COLONOSCOPY:  Colonoscopy to screen for colon cancer is recommended for all women at age 46.  We know, you hate the idea of the prep.  We agree, BUT, having colon cancer and not knowing it is worse!!  Colon cancer so often starts as a polyp that can be seen and removed at colonscopy, which can quite literally save your life!  And if your first colonoscopy is normal and you have no family history of colon cancer, most women don't have to have it again for 10 years.  Once every ten years, you can do something that may end up saving your life, right?  We will be happy to help you get it scheduled when you are ready.  Be sure to check your insurance coverage so you understand how much it will cost.  It may be covered as a preventative service at no cost, but you should check your particular policy.

## 2024-04-28 NOTE — Progress Notes (Signed)
 58 y.o. G2P2 Married Caucasian female here for annual exam.    Stopped the vaginal estradiol  tablets several months ago.  Using olive oil for hydration.  Notes a pulling sensation with sex.  No further postcoital spotting.  Expecting second grandchild in October.   PCP: Loreli Elsie JONETTA Mickey., MD   Patient's last menstrual period was 05/30/2021 (exact date).           Sexually active: Yes.    The current method of family planning is vasectomy.    Menopausal hormone therapy:  n/a  Exercising: Yes.    Walking 2-3 miles  Smoker:  no  OB History  Gravida Para Term Preterm AB Living  2 2    2   SAB IAB Ectopic Multiple Live Births          # Outcome Date GA Lbr Len/2nd Weight Sex Type Anes PTL Lv  2 Para           1 Para              HEALTH MAINTENANCE: Last 2 paps:  12/30/22 neg HR HPV neg, 08/20/19 neg HR HPV neg History of abnormal Pap or positive HPV:  no Mammogram:   12/24/23 Breast Density Cat C, BIRADS Cat 1 neg  Colonoscopy:  04/09/16 - due in 2027 Bone Density:  n/a  Result  n/a.  Will do with her PCP.    Immunization History  Administered Date(s) Administered   Influenza Split 04/15/2012, 05/05/2013, 03/22/2014   Influenza, Quadrivalent, Recombinant, Inj, Pf 04/09/2019, 04/27/2020, 05/11/2021   Janssen (J&J) SARS-COV-2 Vaccination 10/03/2019   Pneumococcal-Unspecified 06/09/2013   Tdap 11/20/2011, 05/11/2021   Zoster, Live 06/09/2013      reports that she has never smoked. She has never used smokeless tobacco. She reports current alcohol  use of about 3.0 standard drinks of alcohol  per week. She reports that she does not use drugs.  Past Medical History:  Diagnosis Date   Anxiety    Endometrial polyp    History of 2019 novel coronavirus disease (COVID-19) 05/2020   per pt had mild symptoms that resolved   IDA (iron deficiency anemia)    09-20-2020  per pt Hg as been normal in past few yrs, but has been on oral medication and iv rron infusions   Menorrhagia  with irregular cycle    Migraine    with aura   PMS (premenstrual syndrome)    Seasonal allergies    Wears glasses     Past Surgical History:  Procedure Laterality Date   COLONOSCOPY WITH PROPOFOL   approx. 2018   DILATATION & CURETTAGE/HYSTEROSCOPY WITH MYOSURE N/A 09/26/2020   Procedure: DILATATION & CURETTAGE/HYSTEROSCOPY WITH MYOSURE/resection of endometrial poly[;  Surgeon: Cathlyn JAYSON Nikki Bobie FORBES, MD;  Location: [facility];  Service: Gynecology;  Laterality: N/A;   WISDOM TOOTH EXTRACTION Bilateral age 53    Current Outpatient Medications  Medication Sig Dispense Refill   BIOTIN PO Take by mouth.     Calcium Carbonate-Vitamin D  (CALCIUM + D PO) Take 1 tablet by mouth daily.     escitalopram (LEXAPRO) 10 MG tablet Take 0.5 tablets by mouth daily.     Multiple Vitamin (MULTIVITAMIN) capsule Take 1 capsule by mouth daily.     Multiple Vitamins-Minerals (VITAMIN D3 COMPLETE PO) Take 1,000 Units by mouth daily.     No current facility-administered medications for this visit.    ALLERGIES: Patient has no known allergies.  Family History  Problem Relation Age of Onset  Hyperlipidemia Mother    Hypertension Mother    Cancer - Lung Mother 34       stage IV non small cell, chemo starting 5/14   COPD Father    Colon polyps Father    Colon cancer Neg Hx    Rectal cancer Neg Hx    Stomach cancer Neg Hx     Review of Systems  All other systems reviewed and are negative.   PHYSICAL EXAM:  BP 122/76 (BP Location: Left Arm, Patient Position: Sitting)   Pulse 71   Ht 5' 2.5 (1.588 m)   Wt 131 lb (59.4 kg)   LMP 05/30/2021 (Exact Date)   SpO2 99%   BMI 23.58 kg/m     General appearance: alert, cooperative and appears stated age Head: normocephalic, without obvious abnormality, atraumatic Neck: no adenopathy, supple, symmetrical, trachea midline and thyroid  normal to inspection and palpation Lungs: clear to auscultation bilaterally Breasts: normal  appearance, no masses or tenderness, No nipple retraction or dimpling, No nipple discharge or bleeding, No axillary adenopathy Heart: regular rate and rhythm Abdomen: soft, non-tender; no masses, no organomegaly Extremities: extremities normal, atraumatic, no cyanosis or edema Skin: skin color, texture, turgor normal. No rashes or lesions Lymph nodes: cervical, supraclavicular, and axillary nodes normal. Neurologic: grossly normal  Pelvic: External genitalia:  no lesions              No abnormal inguinal nodes palpated.              Urethra:  normal appearing urethra with no masses, tenderness or lesions              Bartholins and Skenes: normal                 Vagina: atrophy noted of the posterior introitus.               Cervix: no lesions              Pap taken: no Bimanual Exam:  Uterus:  normal size, contour, position, consistency, mobility, non-tender              Adnexa: no mass, fullness, tenderness              Rectal exam: yes.  Confirms.              Anus:  normal sphincter tone, no lesions  Chaperone was present for exam:  Kari HERO, CMA  ASSESSMENT: Well woman visit with gynecologic exam. Menopausal symptoms.  Hx post coital spotting.  Resolved.  Vaginal atrophy. Hx hysteroscopy with dilation and curettage.  PHQ-2-9: 0  PLAN: Mammogram screening discussed. Self breast awareness reviewed. Pap and HRV collected:  no.  Due in 2029.  Guidelines for Calcium, Vitamin D , regular exercise program including cardiovascular and weight bearing exercise. Medication refills:  Vaginal estradiol  cream.  Instructed in use.  Labs and dexa with PCP.  Follow up:  yearly.

## 2024-06-21 DIAGNOSIS — M7581 Other shoulder lesions, right shoulder: Secondary | ICD-10-CM | POA: Diagnosis not present

## 2024-06-21 DIAGNOSIS — M19011 Primary osteoarthritis, right shoulder: Secondary | ICD-10-CM | POA: Diagnosis not present

## 2024-06-21 DIAGNOSIS — M7582 Other shoulder lesions, left shoulder: Secondary | ICD-10-CM | POA: Diagnosis not present

## 2024-06-21 DIAGNOSIS — M19012 Primary osteoarthritis, left shoulder: Secondary | ICD-10-CM | POA: Diagnosis not present

## 2024-06-22 ENCOUNTER — Ambulatory Visit: Admitting: Podiatry

## 2024-06-29 DIAGNOSIS — M7542 Impingement syndrome of left shoulder: Secondary | ICD-10-CM | POA: Diagnosis not present

## 2024-06-29 DIAGNOSIS — M7541 Impingement syndrome of right shoulder: Secondary | ICD-10-CM | POA: Diagnosis not present

## 2024-07-20 DIAGNOSIS — D509 Iron deficiency anemia, unspecified: Secondary | ICD-10-CM | POA: Diagnosis not present

## 2025-05-04 ENCOUNTER — Ambulatory Visit: Admitting: Obstetrics and Gynecology
